# Patient Record
Sex: Female | Born: 1990 | Race: Black or African American | Hispanic: No | Marital: Single | Smoking: Former smoker
Health system: Southern US, Community
[De-identification: ages and names within clinical notes are randomized; demographics above are authoritative.]

## PROBLEM LIST (undated history)

## (undated) DIAGNOSIS — F419 Anxiety disorder, unspecified: Secondary | ICD-10-CM

## (undated) DIAGNOSIS — F32A Depression, unspecified: Secondary | ICD-10-CM

## (undated) DIAGNOSIS — K589 Irritable bowel syndrome without diarrhea: Secondary | ICD-10-CM

## (undated) DIAGNOSIS — K219 Gastro-esophageal reflux disease without esophagitis: Secondary | ICD-10-CM

## (undated) DIAGNOSIS — N809 Endometriosis, unspecified: Secondary | ICD-10-CM

## (undated) HISTORY — DX: Anxiety disorder, unspecified: F41.9

## (undated) HISTORY — PX: COLONOSCOPY: SHX174

## (undated) HISTORY — DX: Irritable bowel syndrome, unspecified: K58.9

## (undated) HISTORY — DX: Depression, unspecified: F32.A

## (undated) HISTORY — DX: Endometriosis, unspecified: N80.9

---

## 2012-11-14 ENCOUNTER — Emergency Department (HOSPITAL_COMMUNITY)
Admission: EM | Admit: 2012-11-14 | Discharge: 2012-11-14 | Disposition: A | Discharge status: Home / Self Care | Payer: BC Managed Care – PPO | Source: Home / Self Care | Attending: Emergency Medicine | Admitting: Emergency Medicine

## 2012-11-14 ENCOUNTER — Encounter (HOSPITAL_COMMUNITY): Payer: Self-pay | Admitting: *Deleted

## 2012-11-14 DIAGNOSIS — N949 Unspecified condition associated with female genital organs and menstrual cycle: Secondary | ICD-10-CM

## 2012-11-14 DIAGNOSIS — R102 Pelvic and perineal pain: Secondary | ICD-10-CM

## 2012-11-14 DIAGNOSIS — G8929 Other chronic pain: Secondary | ICD-10-CM

## 2012-11-14 LAB — POCT URINALYSIS DIP (DEVICE)
Bilirubin Urine: NEGATIVE
Nitrite: NEGATIVE
Urobilinogen, UA: 0.2 mg/dL (ref 0.0–1.0)
pH: 6 (ref 5.0–8.0)

## 2012-11-14 MED ORDER — NITROFURANTOIN MONOHYD MACRO 100 MG PO CAPS: 100.0000 mg | ORAL_CAPSULE | Freq: Two times a day (BID) | ORAL | Status: AC

## 2012-11-14 MED ORDER — NAPROXEN 375 MG PO TABS: 375.0000 mg | ORAL_TABLET | Freq: Two times a day (BID) | ORAL | Status: AC

## 2012-11-14 NOTE — ED Notes (Signed)
C/o low abdominal pain. Has been going on for years on her R side.  Started on L side 1 month ago.  Pain comes and goes and started back yesterday on both sides.  C/o nausea and headache yesterday. No vomiting or diarrhea. No vaginal discharge.  No UTI symptoms.  LMP 10/24/12 only 3 days, usually 4-5 days.

## 2012-11-14 NOTE — ED Provider Notes (Signed)
Is is her main desire or History     CSN: [id number]  Arrival date & time 11/14/12  1632   First MD Initiated Contact with Patient 11/14/12 1700      Chief Complaint  Patient presents with  . Abdominal Pain    (Consider location/radiation/quality/duration/timing/severity/associated sxs/prior treatment) HPI Comments: Patient presents urgent care complaining of pelvic pain describes a for years she would have right sided pain sometimes in the middle of her cycle but for about a month she's been having a predominantly left-sided pain comes and goes. He started back yesterday on both sides have felt some nausea. Denies any vaginal bleeding, urinary symptoms or vaginal discharge. Patient is non-sexually active her last nausea. Was March 12.  Patient is a 22 y.o. female presenting with abdominal pain. The history is provided by the patient.  Abdominal Pain Pain location:  Suprapubic, LLQ and RLQ Pain quality: aching   Pain quality: not bloating and not burning   Pain radiates to:  Does not radiate Pain severity:  Mild Progression:  Waxing and waning Context: not alcohol use, not awakening from sleep, not diet changes and not eating   Relieved by:  Nothing Worsened by:  Nothing tried Ineffective treatments:  None tried Associated symptoms: nausea   Associated symptoms: no chest pain, no chills, no cough, no diarrhea, no dysuria, no fatigue, no fever, no hematuria, no shortness of breath, no vaginal bleeding and no vomiting     History reviewed. No pertinent past medical history.  History reviewed. No pertinent past surgical history.  Family History  Problem Relation Age of Onset  . Diabetes Father     History  Substance Use Topics  . Smoking status: Current Some Day Smoker    Types: Cigars  . Smokeless tobacco: Not on file  . Alcohol Use: Yes     Comment: occasional    OB History   Grav Para Term Preterm Abortions TAB SAB Ect Mult Living                  Review of  Systems  Constitutional: Negative for fever, chills, diaphoresis, activity change, appetite change and fatigue.  Respiratory: Negative for cough and shortness of breath.   Cardiovascular: Negative for chest pain and leg swelling.  Gastrointestinal: Positive for nausea and abdominal pain. Negative for vomiting, diarrhea, abdominal distention, anal bleeding and rectal pain.  Genitourinary: Positive for pelvic pain. Negative for dysuria, urgency, frequency, hematuria, flank pain, decreased urine volume, vaginal bleeding and genital sores.  Musculoskeletal: Negative for back pain and arthralgias.  Skin: Negative for color change.    Allergies  Metronidazole  Home Medications   Current Outpatient Rx  Name  Route  Sig  Dispense  Refill  . aspirin-sod bicarb-citric acid (ALKA-SELTZER) 325 MG TBEF   Oral   Take 325 mg by mouth every 6 (six) hours as needed.           BP 115/72  Pulse 108  Temp(Src) 98.8 F (37.1 C) (Oral)  Resp 16  SpO2 96%  LMP 10/24/2012  Physical Exam  Nursing note reviewed. Constitutional: Vital signs are normal. She appears well-developed and well-nourished.  Non-toxic appearance. She does not have a sickly appearance. She does not appear ill. No distress.  Eyes: No scleral icterus.  Cardiovascular: Normal rate.  Exam reveals no gallop and no friction rub.   No murmur heard. Abdominal: Soft. She exhibits no distension and no mass. There is no hepatomegaly. There is tenderness in the suprapubic  area. There is no rebound, no guarding, no tenderness at McBurney's point and negative Murphy's sign. No hernia. Hernia confirmed negative in the left inguinal area.  Neurological: She is alert.  Skin: Skin is warm. No rash noted. No erythema.    ED Course  Procedures (including critical care time)  Labs Reviewed  POCT URINALYSIS DIP (DEVICE) - Abnormal; Notable for the following:    Ketones, ur 40 (*)    Hgb urine dipstick TRACE (*)    Protein, ur 30 (*)     Leukocytes, UA TRACE (*)    All other components within normal limits  URINE CULTURE  POCT PREGNANCY, URINE   No results found.   No diagnosis found.    MDM  Pelvic pain x year- soft abdomen, afebrile abnormal urine have sent a sample for urine culture. Started patient on Macrobid him for urine culture results become available. Patient has been instructed to have a comprehensive formal pelvic evaluation that would include a TRANSVAGINAL- OR PELVIC ULTRASOUND patient been symptomatic for years is likely- expressing ovarian pathology  Several referrals for a local gynecologist have been provided the patient.       Jimmie Molly, MD 11/14/12 [phone]

## 2012-11-16 LAB — URINE CULTURE

## 2012-11-28 ENCOUNTER — Telehealth (HOSPITAL_COMMUNITY): Payer: Self-pay | Admitting: *Deleted

## 2012-11-28 NOTE — ED Notes (Signed)
Pt. called on VM and asked for result of urine culture. I called pt. back and left a message to call back tomorrow between 2-4 p @ [phone].

## 2013-02-18 ENCOUNTER — Other Ambulatory Visit: Payer: Self-pay | Admitting: Obstetrics & Gynecology

## 2013-02-20 ENCOUNTER — Encounter (HOSPITAL_COMMUNITY): Payer: Self-pay | Admitting: Pharmacist

## 2013-03-04 ENCOUNTER — Encounter (HOSPITAL_COMMUNITY): Payer: Self-pay | Admitting: *Deleted

## 2013-03-05 ENCOUNTER — Ambulatory Visit (HOSPITAL_COMMUNITY): Payer: BC Managed Care – PPO | Admitting: Anesthesiology

## 2013-03-05 ENCOUNTER — Encounter (HOSPITAL_COMMUNITY)
Admission: RE | Disposition: A | Discharge status: Home / Self Care | Payer: Self-pay | Source: Ambulatory Visit | Attending: Obstetrics & Gynecology

## 2013-03-05 ENCOUNTER — Encounter (HOSPITAL_COMMUNITY): Payer: Self-pay | Admitting: Anesthesiology

## 2013-03-05 ENCOUNTER — Ambulatory Visit (HOSPITAL_COMMUNITY)
Admission: RE | Admit: 2013-03-05 | Discharge: 2013-03-05 | Disposition: A | Discharge status: Home / Self Care | Payer: BC Managed Care – PPO | Source: Ambulatory Visit | Attending: Obstetrics & Gynecology | Admitting: Obstetrics & Gynecology

## 2013-03-05 DIAGNOSIS — K219 Gastro-esophageal reflux disease without esophagitis: Secondary | ICD-10-CM | POA: Insufficient documentation

## 2013-03-05 DIAGNOSIS — N83209 Unspecified ovarian cyst, unspecified side: Secondary | ICD-10-CM | POA: Insufficient documentation

## 2013-03-05 DIAGNOSIS — N803 Endometriosis of pelvic peritoneum, unspecified: Secondary | ICD-10-CM | POA: Insufficient documentation

## 2013-03-05 DIAGNOSIS — N801 Endometriosis of ovary: Secondary | ICD-10-CM | POA: Insufficient documentation

## 2013-03-05 DIAGNOSIS — N949 Unspecified condition associated with female genital organs and menstrual cycle: Secondary | ICD-10-CM | POA: Insufficient documentation

## 2013-03-05 DIAGNOSIS — N80109 Endometriosis of ovary, unspecified side, unspecified depth: Secondary | ICD-10-CM | POA: Insufficient documentation

## 2013-03-05 HISTORY — DX: Gastro-esophageal reflux disease without esophagitis: K21.9

## 2013-03-05 HISTORY — PX: ROBOTIC ASSISTED LAPAROSCOPIC OVARIAN CYSTECTOMY: SHX6081

## 2013-03-05 LAB — CBC
MCV: 83.5 fL (ref 78.0–100.0)
Platelets: 230 10*3/uL (ref 150–400)
RBC: 4.68 MIL/uL (ref 3.87–5.11)
WBC: 4.7 10*3/uL (ref 4.0–10.5)

## 2013-03-05 LAB — PREGNANCY, URINE: Preg Test, Ur: NEGATIVE

## 2013-03-05 SURGERY — ROBOTIC ASSISTED LAPAROSCOPIC OVARIAN CYSTECTOMY
Anesthesia: General | Site: Abdomen | Laterality: Left | Wound class: Clean Contaminated

## 2013-03-05 MED ORDER — PROPOFOL 10 MG/ML IV BOLUS
INTRAVENOUS | Status: DC | PRN
  Administered 2013-03-05: 180 mg via INTRAVENOUS
  Administered 2013-03-05: 20 mg via INTRAVENOUS

## 2013-03-05 MED ORDER — LACTATED RINGERS IV SOLN
INTRAVENOUS | Status: DC
  Administered 2013-03-05 (×3): via INTRAVENOUS

## 2013-03-05 MED ORDER — MIDAZOLAM HCL 2 MG/2ML IJ SOLN
INTRAMUSCULAR | Status: AC
  Filled 2013-03-05: qty 2

## 2013-03-05 MED ORDER — LIDOCAINE HCL (CARDIAC) 20 MG/ML IV SOLN
INTRAVENOUS | Status: DC | PRN
  Administered 2013-03-05: 50 mg via INTRAVENOUS

## 2013-03-05 MED ORDER — METOCLOPRAMIDE HCL 5 MG/ML IJ SOLN: 10.0000 mg | Freq: Once | INTRAMUSCULAR | Status: DC | PRN

## 2013-03-05 MED ORDER — FENTANYL CITRATE 0.05 MG/ML IJ SOLN
INTRAMUSCULAR | Status: AC
  Administered 2013-03-05: 25 ug via INTRAVENOUS
  Filled 2013-03-05: qty 2

## 2013-03-05 MED ORDER — GLYCOPYRROLATE 0.2 MG/ML IJ SOLN
INTRAMUSCULAR | Status: AC
  Filled 2013-03-05: qty 2

## 2013-03-05 MED ORDER — FENTANYL CITRATE 0.05 MG/ML IJ SOLN
INTRAMUSCULAR | Status: DC | PRN
  Administered 2013-03-05: 50 ug via INTRAVENOUS
  Administered 2013-03-05 (×2): 100 ug via INTRAVENOUS

## 2013-03-05 MED ORDER — KETOROLAC TROMETHAMINE 30 MG/ML IJ SOLN
INTRAMUSCULAR | Status: AC
  Filled 2013-03-05: qty 1

## 2013-03-05 MED ORDER — CEFAZOLIN SODIUM-DEXTROSE 2-3 GM-% IV SOLR
2.0000 g | INTRAVENOUS | Status: AC
  Administered 2013-03-05: 2 g via INTRAVENOUS

## 2013-03-05 MED ORDER — FENTANYL CITRATE 0.05 MG/ML IJ SOLN
INTRAMUSCULAR | Status: AC
  Filled 2013-03-05: qty 5

## 2013-03-05 MED ORDER — BUPIVACAINE HCL (PF) 0.25 % IJ SOLN
INTRAMUSCULAR | Status: DC | PRN
  Administered 2013-03-05: 14 mL

## 2013-03-05 MED ORDER — LACTATED RINGERS IR SOLN
Status: DC | PRN
  Administered 2013-03-05: 3000 mL

## 2013-03-05 MED ORDER — ARTIFICIAL TEARS OP OINT
TOPICAL_OINTMENT | OPHTHALMIC | Status: AC
  Filled 2013-03-05: qty 3.5

## 2013-03-05 MED ORDER — GLYCOPYRROLATE 0.2 MG/ML IJ SOLN
INTRAMUSCULAR | Status: DC | PRN
  Administered 2013-03-05: .8 mg via INTRAVENOUS

## 2013-03-05 MED ORDER — ONDANSETRON HCL 4 MG/2ML IJ SOLN
INTRAMUSCULAR | Status: DC | PRN
  Administered 2013-03-05: 4 mg via INTRAVENOUS

## 2013-03-05 MED ORDER — FENTANYL CITRATE 0.05 MG/ML IJ SOLN
25.0000 ug | INTRAMUSCULAR | Status: DC | PRN
  Administered 2013-03-05: 25 ug via INTRAVENOUS

## 2013-03-05 MED ORDER — DEXAMETHASONE SODIUM PHOSPHATE 10 MG/ML IJ SOLN
INTRAMUSCULAR | Status: AC
  Filled 2013-03-05: qty 1

## 2013-03-05 MED ORDER — ONDANSETRON HCL 4 MG/2ML IJ SOLN
INTRAMUSCULAR | Status: AC
  Filled 2013-03-05: qty 2

## 2013-03-05 MED ORDER — ROCURONIUM BROMIDE 100 MG/10ML IV SOLN
INTRAVENOUS | Status: DC | PRN
  Administered 2013-03-05: 10 mg via INTRAVENOUS
  Administered 2013-03-05: 40 mg via INTRAVENOUS
  Administered 2013-03-05: 10 mg via INTRAVENOUS

## 2013-03-05 MED ORDER — NEOSTIGMINE METHYLSULFATE 1 MG/ML IJ SOLN
INTRAMUSCULAR | Status: DC | PRN
  Administered 2013-03-05: 4 mg via INTRAVENOUS

## 2013-03-05 MED ORDER — DEXAMETHASONE SODIUM PHOSPHATE 10 MG/ML IJ SOLN
INTRAMUSCULAR | Status: DC | PRN
  Administered 2013-03-05: 10 mg via INTRAVENOUS

## 2013-03-05 MED ORDER — PROPOFOL 10 MG/ML IV EMUL
INTRAVENOUS | Status: AC
  Filled 2013-03-05: qty 20

## 2013-03-05 MED ORDER — CEFAZOLIN SODIUM-DEXTROSE 2-3 GM-% IV SOLR
INTRAVENOUS | Status: AC
  Filled 2013-03-05: qty 50

## 2013-03-05 MED ORDER — BUPIVACAINE HCL (PF) 0.25 % IJ SOLN
INTRAMUSCULAR | Status: AC
  Filled 2013-03-05: qty 30

## 2013-03-05 MED ORDER — NEOSTIGMINE METHYLSULFATE 1 MG/ML IJ SOLN
INTRAMUSCULAR | Status: AC
  Filled 2013-03-05: qty 1

## 2013-03-05 MED ORDER — ROCURONIUM BROMIDE 50 MG/5ML IV SOLN
INTRAVENOUS | Status: AC
  Filled 2013-03-05: qty 1

## 2013-03-05 MED ORDER — MIDAZOLAM HCL 5 MG/5ML IJ SOLN
INTRAMUSCULAR | Status: DC | PRN
  Administered 2013-03-05: 2 mg via INTRAVENOUS

## 2013-03-05 MED ORDER — KETOROLAC TROMETHAMINE 30 MG/ML IJ SOLN
INTRAMUSCULAR | Status: DC | PRN
  Administered 2013-03-05: 30 mg via INTRAVENOUS

## 2013-03-05 MED ORDER — MEPERIDINE HCL 25 MG/ML IJ SOLN: 6.2500 mg | INTRAMUSCULAR | Status: DC | PRN

## 2013-03-05 MED ORDER — OXYCODONE-ACETAMINOPHEN 7.5-325 MG PO TABS: 1.0000 | ORAL_TABLET | ORAL | Status: DC | PRN

## 2013-03-05 MED ORDER — LIDOCAINE HCL (CARDIAC) 20 MG/ML IV SOLN
INTRAVENOUS | Status: AC
  Filled 2013-03-05: qty 5

## 2013-03-05 SURGICAL SUPPLY — 62 items
BARRIER ADHS 3X4 INTERCEED (GAUZE/BANDAGES/DRESSINGS) ×4 IMPLANT
CATH FOLEY 3WAY  5CC 16FR (CATHETERS) ×1
CATH FOLEY 3WAY 5CC 16FR (CATHETERS) ×1 IMPLANT
CHLORAPREP W/TINT 26ML (MISCELLANEOUS) ×2 IMPLANT
CLOTH BEACON ORANGE TIMEOUT ST (SAFETY) ×2 IMPLANT
CONT PATH 16OZ SNAP LID 3702 (MISCELLANEOUS) ×2 IMPLANT
COVER MAYO STAND STRL (DRAPES) ×2 IMPLANT
COVER TABLE BACK 60X90 (DRAPES) ×4 IMPLANT
COVER TIP SHEARS 8 DVNC (MISCELLANEOUS) ×1 IMPLANT
COVER TIP SHEARS 8MM DA VINCI (MISCELLANEOUS) ×1
DECANTER SPIKE VIAL GLASS SM (MISCELLANEOUS) ×2 IMPLANT
DERMABOND ADVANCED (GAUZE/BANDAGES/DRESSINGS) ×1
DERMABOND ADVANCED .7 DNX12 (GAUZE/BANDAGES/DRESSINGS) ×1 IMPLANT
DRAPE HUG U DISPOSABLE (DRAPE) ×2 IMPLANT
DRAPE LG THREE QUARTER DISP (DRAPES) ×4 IMPLANT
DRAPE WARM FLUID 44X44 (DRAPE) ×2 IMPLANT
ELECT REM PT RETURN 9FT ADLT (ELECTROSURGICAL) ×2
ELECTRODE REM PT RTRN 9FT ADLT (ELECTROSURGICAL) ×1 IMPLANT
EVACUATOR SMOKE 8.L (FILTER) ×2 IMPLANT
GAUZE VASELINE 3X9 (GAUZE/BANDAGES/DRESSINGS) IMPLANT
GLOVE BIO SURGEON STRL SZ 6.5 (GLOVE) ×2 IMPLANT
GLOVE BIOGEL PI IND STRL 7.0 (GLOVE) ×1 IMPLANT
GLOVE BIOGEL PI INDICATOR 7.0 (GLOVE) ×1
GOWN STRL REIN XL XLG (GOWN DISPOSABLE) ×12 IMPLANT
IV STOPCOCK 4 WAY 40  W/Y SET (IV SOLUTION)
IV STOPCOCK 4 WAY 40 W/Y SET (IV SOLUTION) IMPLANT
KIT ACCESSORY DA VINCI DISP (KITS) ×1
KIT ACCESSORY DVNC DISP (KITS) ×1 IMPLANT
NEEDLE HYPO 22GX1.5 SAFETY (NEEDLE) IMPLANT
OCCLUDER COLPOPNEUMO (BALLOONS) IMPLANT
PACK LAVH (CUSTOM PROCEDURE TRAY) ×2 IMPLANT
PAD PREP 24X48 CUFFED NSTRL (MISCELLANEOUS) ×4 IMPLANT
PROTECTOR NERVE ULNAR (MISCELLANEOUS) ×4 IMPLANT
SET CYSTO W/LG BORE CLAMP LF (SET/KITS/TRAYS/PACK) IMPLANT
SET IRRIG TUBING LAPAROSCOPIC (IRRIGATION / IRRIGATOR) ×4 IMPLANT
SOLUTION ELECTROLUBE (MISCELLANEOUS) ×2 IMPLANT
SUT VIC AB 0 CT1 27 (SUTURE)
SUT VIC AB 0 CT1 27XBRD ANTBC (SUTURE) IMPLANT
SUT VIC AB 0 CT2 27 (SUTURE) IMPLANT
SUT VIC AB 2-0 CT1 27 (SUTURE)
SUT VIC AB 2-0 CT1 TAPERPNT 27 (SUTURE) IMPLANT
SUT VIC AB 3-0 SH 27 (SUTURE)
SUT VIC AB 3-0 SH 27X BRD (SUTURE) IMPLANT
SUT VIC AB 4-0 PS2 27 (SUTURE) ×4 IMPLANT
SUT VICRYL 0 UR6 27IN ABS (SUTURE) ×4 IMPLANT
SYR 50ML LL SCALE MARK (SYRINGE) ×2 IMPLANT
SYSTEM CONVERTIBLE TROCAR (TROCAR) ×2 IMPLANT
TIP UTERINE 5.1X6CM LAV DISP (MISCELLANEOUS) IMPLANT
TIP UTERINE 6.7X10CM GRN DISP (MISCELLANEOUS) IMPLANT
TIP UTERINE 6.7X6CM WHT DISP (MISCELLANEOUS) IMPLANT
TIP UTERINE 6.7X8CM BLUE DISP (MISCELLANEOUS) IMPLANT
TOWEL OR 17X24 6PK STRL BLUE (TOWEL DISPOSABLE) ×6 IMPLANT
TRAY FOLEY BAG SILVER LF 14FR (CATHETERS) IMPLANT
TROCAR 12M 150ML BLUNT (TROCAR) IMPLANT
TROCAR DISP BLADELESS 8 DVNC (TROCAR) ×1 IMPLANT
TROCAR DISP BLADELESS 8MM (TROCAR) ×1
TROCAR OPTI TIP 12M 100M (ENDOMECHANICALS) IMPLANT
TROCAR XCEL 12X100 BLDLESS (ENDOMECHANICALS) ×2 IMPLANT
TROCAR XCEL NON-BLD 5MMX100MML (ENDOMECHANICALS) ×2 IMPLANT
TUBING FILTER THERMOFLATOR (ELECTROSURGICAL) ×4 IMPLANT
WARMER LAPAROSCOPE (MISCELLANEOUS) ×2 IMPLANT
WATER STERILE IRR 1000ML POUR (IV SOLUTION) ×6 IMPLANT

## 2013-03-05 NOTE — Op Note (Addendum)
03/05/2013  3:03 PM  PATIENT:  Kristen Barnett  22 y.o. female  PRE-OPERATIVE DIAGNOSIS:  Pelvic Pain, Complex Left Ovarian Cyst  [phone]  POST-OPERATIVE DIAGNOSIS:  Left and right ovarian endometriomas; Moderate pelvic endometriosis; Pelvic adhesions  PROCEDURE:  Procedure(s): ROBOTIC ASSISTED LAPAROSCOPIC BILATERAL OVARIAN CYSTECTOMIES; LYSIS OF ADHESIONS  With Treatment of Endometriosis.  SURGEON:  Surgeon(s): Genia Del, MD  ASSISTANTS: Shea Evans MD   ANESTHESIA:   general  PROCEDURE:  Under general anesthesia with endotracheal intubation, patient is in decubitus position.  She is prepped with Chloraprep on the abdomen and Betadine on suprapubic, vulvar and vaginal areas.  The uterus is cannulated with an Ulka canula.  A Foley is inserted in the bladder.  All incision sites are infiltrated with Marcaine 0.25% plain and incisions are made with the scalpel at the infraumbilical area and 2 on each side of the lower abdomen.  A pursestring stitch is done with a Vicryl 0 at the aponeurosis at the infraumbilical incision.  The Roseanne Reno is introduced with the camera at that level.  The abdominal cavity is normal in appearance with a normal liver, gall-bladder and appendix.  The pelvic cavity shows moderate endometriosis.  The uterus is normal in size and appearance.  Both tubes are normal in appearance with normal fimbriae.  Both ovaries are adherent to their ovarian fossae, to the back of the uterus and to the sigmoid.  The left ovary has an endometrioma of about 3 cm in diameter.  A smaller endometrioma is present on the right ovary.  The sigmoid is adherent to the post uterus to the uterosacral ligament attachments.   The ports are inserted in a semi-circular configuration under direct vision.  The robot is docked from the right side.  Instruments are put in place under direct vision with the Endoshear scisors on the right arm, the PK in the left arm and the fenestrated clamp in the 3rd arm.   We then go to the console.  Both ureters are in normal anatomic position.  Pictures are taken at the beginning and at the end of the procedure.  We proceed with lysis of adhesions, freeing the left ovary completely.  The endometrioma is rupture during this process and typical brown thick fluid escapes.  The Nezhat is used to irrigate and suction.  The tip of the endoshear scisors is used to open the left ovary at the level of the endometrioma.  The wall of the endometrioma is removed by traction completely and sent to pathology.  Hemostasis is completed in the bed of the left ovary.  We then lyse the adhesions around the right ovary.  A smaller endometrioma is removed from that ovary as well and the wall sent to pathology.  Hemostasis is completed in the bed of the right ovary.  The decision is made not to lyse adhesions between the sigmoid and the lower uterus as those adhesions are thick and the risk of bowel trauma is higher than the possible benefits.  No adhesion is present elsewhere.  No endometriosis is present in the anterior cul-the-sac.  Hemostasis is adequate at all levels.  The robotic instruments are therefore removed.  The robot is undocked and the surgery is continued by laparoscopy.        The deep Trendelenburg is removed.  Irrigation and suction is completed.  Interseed is applied in the posterior cul-the-sac and around both ovaries.  All laparoscopic instruments are removed.  The CO2 is evacuated.  The purstring stitch is  attached at the infraumbilical incision.  A figure of eight of vicryl 0 is added at the aponeurosis to completely close it.  The skin is closed with Vicryl 4-0 on all incisions and Dermabond is added.  Hemostasis is adequate.  The vaginal canula is removed.  The patient is brought to recovery room in good and stable status.  ESTIMATED BLOOD LOSS:  25 cc   Intake/Output Summary (Last 24 hours) at 03/05/13 1503 Last data filed at 03/05/13 1453  Gross per 24 hour  Intake    1700 ml  Output    525 ml  Net   1175 ml     BLOOD ADMINISTERED:none   LOCAL MEDICATIONS USED:  MARCAINE     SPECIMEN:  Source of Specimen:  Left and right ovarian cyst walls  DISPOSITION OF SPECIMEN:  PATHOLOGY  COUNTS:  YES  PLAN OF CARE: Transfer to PACU  Genia Del MD  03/05/2013 at 3:06 pm

## 2013-03-05 NOTE — OR Nursing (Signed)
Olegario Messier ganim rn documenting under [employer] employee number . Margarita Mail rn

## 2013-03-05 NOTE — Transfer of Care (Signed)
Immediate Anesthesia Transfer of Care Note  Patient: Kristen Barnett  Procedure(s) Performed: Procedure(s) with comments: ROBOTIC ASSISTED LAPAROSCOPIC BILATERAL OVARIAN CYSTECTOMY; LYSIS OF ADHESIONS  With Treatment of Endometriosis (Left) - 2 hrs.  Patient Location: PACU  Anesthesia Type:General  Level of Consciousness: awake  Airway & Oxygen Therapy: Patient Spontanous Breathing  Post-op Assessment: Report given to PACU RN  Post vital signs: stable  Filed Vitals:   03/05/13 1210  BP: 124/83  Pulse: 84  Temp: 37 C  Resp: 18    Complications: No apparent anesthesia complications

## 2013-03-05 NOTE — Anesthesia Preprocedure Evaluation (Addendum)
Anesthesia Evaluation    Airway Mallampati: I TM Distance: >3 FB Neck ROM: Full    Dental no notable dental hx. (+) Teeth Intact   Pulmonary Current Smoker,  breath sounds clear to auscultation  Pulmonary exam normal       Cardiovascular negative cardio ROS  Rhythm:Regular Rate:Normal     Neuro/Psych negative neurological ROS  negative psych ROS   GI/Hepatic Neg liver ROS, GERD-  Medicated and Controlled,  Endo/Other  negative endocrine ROS  Renal/GU negative Renal ROS  negative genitourinary   Musculoskeletal negative musculoskeletal ROS (+)   Abdominal Normal abdominal exam  (+)   Peds  Hematology negative hematology ROS (+)   Anesthesia Other Findings   Reproductive/Obstetrics Pelvic Pain Ovarian Cyst                         Anesthesia Physical Anesthesia Plan  ASA: II  Anesthesia Plan: General   Post-op Pain Management:    Induction: Intravenous  Airway Management Planned: Oral ETT  Additional Equipment:   Intra-op Plan:   Post-operative Plan: Extubation in OR  Informed Consent: I have reviewed the patients History and Physical, chart, labs and discussed the procedure including the risks, benefits and alternatives for the proposed anesthesia with the patient or authorized representative who has indicated his/her understanding and acceptance.   Dental advisory given  Plan Discussed with: Anesthesiologist, CRNA and Surgeon  Anesthesia Plan Comments:         Anesthesia Quick Evaluation

## 2013-03-05 NOTE — Discharge Summary (Signed)
  Physician Discharge Summary  Patient ID: Kristen Barnett MRN: [id number] DOB/AGE: 09-29-1990 21 y.o.  Admit date: 03/05/2013 Discharge date: 03/05/2013  Admission Diagnoses: Pelvic Pain, Complex Left Ovarian Cyst  [id number]  Discharge Diagnoses: Pelvic Pain, Complex Left Ovarian Cyst  [phone]        Active Problems:   * No active hospital problems. *   Discharged Condition: good  Hospital Course: Outpatient  Consults: None  Treatments: surgery: Robotic bilateral ovarian cystectomies, lysis of adhesions, treatment of endometriosis.  Disposition: 01-Home or Self Care     Medication List         ibuprofen 200 MG tablet  Commonly known as:  ADVIL,MOTRIN  Take 400 mg by mouth every 6 (six) hours as needed for pain.     oxyCODONE-acetaminophen 7.5-325 MG per tablet  Commonly known as:  PERCOCET  Take 1 tablet by mouth every 4 (four) hours as needed for pain.     simethicone 80 MG chewable tablet  Commonly known as:  MYLICON  Chew 80 mg by mouth daily as needed for flatulence.           Follow-up Information   Follow up with Caria Transue,MARIE-LYNE, MD In 3 weeks.   Contact information:   [address] [place] [address] [phone]       Signed: Genia Del, MD 03/05/2013, 3:31 PM

## 2013-03-05 NOTE — H&P (Signed)
Kristen Barnett is an 22 y.o. female G0  RP:  Left ovarian solid lesion  Pertinent Gynecological History: Menses: flow is moderate Contraception: none Blood transfusions: none Sexually transmitted diseases: no past history Previous GYN Procedures: None  Last mammogram:  none Last pap: normal OB History:  G0   Menstrual History:  Patient's last menstrual period was 02/19/2013.    Past Medical History  Diagnosis Date  . GERD (gastroesophageal reflux disease)     History -diet controlled - no meds    History reviewed. No pertinent past surgical history.  Family History  Problem Relation Age of Onset  . Diabetes Father     Social History:  reports that she has been smoking Cigars.  She has never used smokeless tobacco. She reports that  drinks alcohol. She reports that she does not use illicit drugs.  Allergies:  Allergies  Allergen Reactions  . Metronidazole Nausea And Vomiting    Made her kidneys hurt.    Prescriptions prior to admission  Medication Sig Dispense Refill  . ibuprofen (ADVIL,MOTRIN) 200 MG tablet Take 400 mg by mouth every 6 (six) hours as needed for pain.      . simethicone (MYLICON) 80 MG chewable tablet Chew 80 mg by mouth daily as needed for flatulence.        Blood pressure 124/83, pulse 84, temperature 98.6 F (37 C), temperature source Oral, resp. rate 18, height 5\' 1"  (1.549 m), weight 71.215 kg (157 lb), last menstrual period 02/19/2013, SpO2 100.00%.  Pelvic US  Left ov. Solid lesion 2.3 cm, possible fibroma or endometrioma.  Results for orders placed during the hospital encounter of 03/05/13 (from the past 24 hour(s))  PREGNANCY, URINE     Status: None   Collection Time    03/05/13 12:00 PM      Result Value Range   Preg Test, Ur NEGATIVE  NEGATIVE  CBC     Status: None   Collection Time    03/05/13 12:14 PM      Result Value Range   WBC 4.7  4.0 - 10.5 K/uL   RBC 4.68  3.87 - 5.11 MIL/uL   Hemoglobin 13.1  12.0 - 15.0 g/dL   HCT  16.1  09.6 - 04.5 %   MCV 83.5  78.0 - 100.0 fL   MCH 28.0  26.0 - 34.0 pg   MCHC 33.5  30.0 - 36.0 g/dL   RDW 40.9  81.1 - 91.4 %   Platelets 230  150 - 400 K/uL    No results found.  Assessment/Plan: Left ovarian lesion for Left ovarian cystectomy, poss. Lt oophorectomy assisted with Engineer, building services.  Surgery and risks reviewed.  Edger Husain,MARIE-LYNE 03/05/2013, 12:57 PM

## 2013-03-05 NOTE — Anesthesia Postprocedure Evaluation (Signed)
  Anesthesia Post-op Note  Patient: Kristen Barnett  Procedure(s) Performed: Procedure(s) with comments: ROBOTIC ASSISTED LAPAROSCOPIC BILATERAL OVARIAN CYSTECTOMY; LYSIS OF ADHESIONS  With Treatment of Endometriosis (Left) - 2 hrs.  Patient Location: PACU  Anesthesia Type:General  Level of Consciousness: awake, alert  and oriented  Airway and Oxygen Therapy: Patient Spontanous Breathing  Post-op Pain: mild  Post-op Assessment: Post-op Vital signs reviewed, Patient's Cardiovascular Status Stable, Respiratory Function Stable, Patent Airway, No signs of Nausea or vomiting and Pain level controlled  Post-op Vital Signs: Reviewed and stable  Complications: No apparent anesthesia complications

## 2013-03-06 ENCOUNTER — Encounter (HOSPITAL_COMMUNITY): Payer: Self-pay | Admitting: Obstetrics & Gynecology

## 2018-12-31 ENCOUNTER — Ambulatory Visit
Admission: RE | Admit: 2018-12-31 | Discharge: 2018-12-31 | Disposition: A | Discharge status: Home / Self Care | Payer: Self-pay | Source: Ambulatory Visit | Attending: Urgent Care | Admitting: Urgent Care

## 2018-12-31 ENCOUNTER — Other Ambulatory Visit: Payer: Self-pay

## 2018-12-31 ENCOUNTER — Other Ambulatory Visit: Payer: Self-pay | Admitting: Urgent Care

## 2018-12-31 DIAGNOSIS — M79641 Pain in right hand: Secondary | ICD-10-CM

## 2018-12-31 DIAGNOSIS — M79605 Pain in left leg: Secondary | ICD-10-CM

## 2021-04-01 DIAGNOSIS — K589 Irritable bowel syndrome without diarrhea: Secondary | ICD-10-CM | POA: Insufficient documentation

## 2021-04-09 ENCOUNTER — Encounter: Payer: Self-pay | Admitting: Nurse Practitioner

## 2021-05-10 ENCOUNTER — Other Ambulatory Visit: Payer: Self-pay

## 2021-05-13 ENCOUNTER — Encounter: Payer: Self-pay | Admitting: Nurse Practitioner

## 2021-05-13 ENCOUNTER — Ambulatory Visit (INDEPENDENT_AMBULATORY_CARE_PROVIDER_SITE_OTHER): Payer: BC Managed Care – PPO | Admitting: Nurse Practitioner

## 2021-05-13 ENCOUNTER — Other Ambulatory Visit (INDEPENDENT_AMBULATORY_CARE_PROVIDER_SITE_OTHER): Payer: BC Managed Care – PPO

## 2021-05-13 VITALS — BP 108/64 | HR 74 | Ht 61.0 in | Wt 157.0 lb

## 2021-05-13 DIAGNOSIS — K5900 Constipation, unspecified: Secondary | ICD-10-CM | POA: Diagnosis not present

## 2021-05-13 DIAGNOSIS — R1084 Generalized abdominal pain: Secondary | ICD-10-CM

## 2021-05-13 DIAGNOSIS — K625 Hemorrhage of anus and rectum: Secondary | ICD-10-CM

## 2021-05-13 LAB — CBC WITH DIFFERENTIAL/PLATELET
Basophils Absolute: 0 10*3/uL (ref 0.0–0.1)
Basophils Relative: 0.6 % (ref 0.0–3.0)
Eosinophils Absolute: 0 10*3/uL (ref 0.0–0.7)
Eosinophils Relative: 0.5 % (ref 0.0–5.0)
HCT: 37 % (ref 36.0–46.0)
Hemoglobin: 12.1 g/dL (ref 12.0–15.0)
Lymphocytes Relative: 24.1 % (ref 12.0–46.0)
Lymphs Abs: 1.3 10*3/uL (ref 0.7–4.0)
MCHC: 32.8 g/dL (ref 30.0–36.0)
MCV: 85 fl (ref 78.0–100.0)
Monocytes Absolute: 0.4 10*3/uL (ref 0.1–1.0)
Monocytes Relative: 8 % (ref 3.0–12.0)
Neutro Abs: 3.5 10*3/uL (ref 1.4–7.7)
Neutrophils Relative %: 66.8 % (ref 43.0–77.0)
Platelets: 207 10*3/uL (ref 150.0–400.0)
RBC: 4.36 Mil/uL (ref 3.87–5.11)
RDW: 13.4 % (ref 11.5–15.5)
WBC: 5.2 10*3/uL (ref 4.0–10.5)

## 2021-05-13 LAB — COMPREHENSIVE METABOLIC PANEL
ALT: 9 U/L (ref 0–35)
AST: 12 U/L (ref 0–37)
Albumin: 4 g/dL (ref 3.5–5.2)
Alkaline Phosphatase: 51 U/L (ref 39–117)
BUN: 8 mg/dL (ref 6–23)
CO2: 26 mEq/L (ref 19–32)
Calcium: 9 mg/dL (ref 8.4–10.5)
Chloride: 104 mEq/L (ref 96–112)
Creatinine, Ser: 0.77 mg/dL (ref 0.40–1.20)
GFR: 103.1 mL/min (ref >60.00)
Glucose, Bld: 81 mg/dL (ref 70–99)
Potassium: 3.9 mEq/L (ref 3.5–5.1)
Sodium: 138 mEq/L (ref 135–145)
Total Bilirubin: 0.5 mg/dL (ref 0.2–1.2)
Total Protein: 6.9 g/dL (ref 6.0–8.3)

## 2021-05-13 LAB — TSH: TSH: 2.47 u[IU]/mL (ref 0.35–5.50)

## 2021-05-13 NOTE — Patient Instructions (Addendum)
LABS:  Lab work has been ordered for you today. Our lab is located in the basement. Press "B" on the elevator. The lab is located at the first door on the left as you exit the elevator.  HEALTHCARE LAWS AND MY CHART RESULTS: Due to recent changes in healthcare laws, you may see the results of your imaging and laboratory studies on MyChart before your provider has had a chance to review them.   We understand that in some cases there may be results that are confusing or concerning to you. Not all laboratory results come back in the same time frame and the provider may be waiting for multiple results in order to interpret others.  Please give [place] 48 hours in order for your provider to thoroughly review all the results before contacting the office for clarification of your results.   RECOMMENDATIONS: Continue Linzess 290 mcg once a day and may alternate with 145 mcg if needed. Please call our office if your symptoms worsen.  We have scheduled you a follow up with Dr. Lorenso Courier on 07/06/21 at 3:40.  It was great seeing you today! Thank you for entrusting me with your care and choosing [facility].  Noralyn Pick, CRNP  The [place] [facility] providers would like to encourage you to use [facility] to communicate with providers for non-urgent requests or questions.  Due to long hold times on the telephone, sending your provider a message by [facility] may be faster and more efficient way to get a response. Please allow 48 business hours for a response.  Please remember that this is for non-urgent requests/questions. If you are age 31 or older, your body mass index should be between 23-30. Your Body mass index is 29.66 kg/m. If this is out of the aforementioned range listed, please consider follow up with your Primary Care Provider.  If you are age 31 or younger, your body mass index should be between 19-25. Your Body mass index is 29.66 kg/m. If this is out of the aformentioned range listed,  please consider follow up with your Primary Care Provider.

## 2021-05-13 NOTE — Progress Notes (Signed)
05/13/2021 Kristen Barnett [id number] [birth year]   CHIEF COMPLAINT: Constipation  HISTORY OF PRESENT ILLNESS:  Kristen Barnett is a 30 year old female with a past medical history of endometriosis GERD and constipation.  S/P robotic assisted laparoscopic ovarian cystectomy with lysis of adhesions with treatment of endometriosis 2014. She presents to our office today as referred by Everardo Beals NP for further evaluation regarding constipation.  She typically passed a normal formed brown bowel movement most days, however, she developed constipation approximately 3 months ago.  She reported going 2 to 3 days without passing a BM.  She was prescribed Linzess 145 mcg daily by her PCP which she took for 4 days without improvement so the dose was increased to 290 mcg daily.  She took the higher dose of Linzess for 3 days which resulted in 1-2 diarrhea episodes for 2 consecutive days so she stopped taking it.  She contacted her PCP and was then prescribed Linzess 72 mcg daily which was ineffective so she went back to taking Linzess 145 mcg daily.  She reported seeing a small amount of bright red blood on the toilet tissue 1 or 2 years ago which occurred when she was on her menstrual cycle.  Since then, she is seen a few spots of red blood mixed in the stool on 2 or 3 occasions, the last episode occurred 1-1/2 months ago.  No associated anorectal pain.  She has a variable abdominal pain which started a few months ago, initially she had pain to the left mid abdomen then moved to the right lower abdomen and then around the bellybutton area.  No significant abdominal pain.  She is concerned she may have a chronic appendicitis.  No fevers. No family history of IBD or colorectal cancer.  She has a remote history of GERD 11 years ago.  She has infrequent heartburn depend upon what she eats which might occur once every 3 to 4 months.  Initially had some nausea when her constipation began but this has  resolved.   Past Medical History:  Diagnosis Date   GERD (gastroesophageal reflux disease)    History -diet controlled - no meds   Past Surgical History:  Procedure Laterality Date   ROBOTIC ASSISTED LAPAROSCOPIC OVARIAN CYSTECTOMY Left 03/05/2013   Procedure: ROBOTIC ASSISTED LAPAROSCOPIC BILATERAL OVARIAN CYSTECTOMY; LYSIS OF ADHESIONS  With Treatment of Endometriosis;  Surgeon: Princess Bruins, MD;  Location: [facility];  Service: Gynecology;  Laterality: Left;  2 hrs.   Social History: She is single.  She is employed in tech-support.  Smoked cigarettes  1 1/2 years on and off, stopped smoking 8 years ago. Infrequent alcohol use. No drug use. Occasional CBD use.   Family History: Father with diabetes. Mother medical history unknown.  No family history of esophageal, gastric or colon cancer.  Outpatient Encounter Medications as of 05/13/2021  Medication Sig   ibuprofen (ADVIL,MOTRIN) 200 MG tablet Take 400 mg by mouth every 6 (six) hours as needed for pain.   linaclotide (LINZESS) 145 MCG CAPS capsule Linzess 145 mcg capsule  Take 1 capsule every day by oral route for 90 days.   linaclotide (LINZESS) 72 MCG capsule Linzess 72 mcg capsule  TAKE 1 CAPSULE BY MOUTH EVERY DAY FOR 90 DAYS   ondansetron (ZOFRAN-ODT) 8 MG disintegrating tablet ondansetron 8 mg disintegrating tablet  PLACE 1 TABLET 3 TIMES A DAY BY TRANSLINGUAL ROUTE.   oxyCODONE-acetaminophen (PERCOCET) 7.5-325 MG per tablet Take 1 tablet by mouth every 4 (four)  hours as needed for pain.   simethicone (MYLICON) 80 MG chewable tablet Chew 80 mg by mouth daily as needed for flatulence.   No facility-administered encounter medications on file as of 05/13/2021.   REVIEW OF SYSTEMS:  Gen: + Night sweats. No weight loss.  CV: Denies chest pain, palpitations or edema. Resp: Denies cough, shortness of breath of hemoptysis.  GI: See HPI.   GU : Denies urinary burning, blood in urine, increased urinary frequency or incontinence. MS:  + Lower back pain.  Derm: Denies rash, itchiness, skin lesions or unhealing ulcers. Psych: + Anxiety. Heme: Denies bruising, bleeding. Neuro:  + Headaches. No dizziness or paresthesias. Endo:  Denies any problems with DM, thyroid or adrenal function.   PHYSICAL EXAM: BP 108/64   Pulse 74   Ht 5\' 1"  (1.549 m)   Wt 157 lb (71.2 kg)   BMI 29.66 kg/m  General: 30 year old female no acute distress. Head: Normocephalic and atraumatic. Eyes:  Sclerae non-icteric, conjunctive pink. Ears: Normal auditory acuity. Mouth: Dentition intact. No ulcers or lesions.  Neck: Supple, no lymphadenopathy or thyromegaly.  Lungs: Clear bilaterally to auscultation without wheezes, crackles or rhonchi. Heart: Regular rate and rhythm. No murmur, rub or gallop appreciated.  Abdomen: Soft, nontender, non distended. No masses. No hepatosplenomegaly. Normoactive bowel sounds x 4 quadrants.  Rectal: Deferred. Musculoskeletal: Symmetrical with no gross deformities. Skin: Warm and dry. No rash or lesions on visible extremities. Extremities: No edema. Neurological: Alert oriented x 4, no focal deficits.  Psychological:  Alert and cooperative. Normal mood and affect.  ASSESSMENT AND PLAN:  67) 30 year old female with constipation which started 3 months ago, variable response to 3 doses of Linzess -I recommended for the patient to continue to take Linzess 290 mcg 1 p.o. daily if tolerated with the instructions for the first 2 weeks she very well may have 1-2 episodes of diarrhea daily which is expected when initiating Linzess.  However, if she experiences excessive diarrhea then to discontinue this medication.  She may also consider alternating Linzess 290 mcg QOD alternating with  145cmg QOD -TSH, CBC and CMP -She will call our office if she is not unable to manage her constipation with Linzess and alternative regimen will be prescribed  2) She reported having one episode of bright red on the toilet tissue 1 or 2  years ago without similar recurrence, however, she has noted a small dot of blood on the stool on a few occasions since then without associated anorectal pain. -I discussed scheduling a future diagnostic colonoscopy, to discuss further at the time of her follow-up appointment in 2 to 3 months -Patient to call our office if her rectal bleeding recurs    CC:  Everardo Beals, NP

## 2021-05-14 NOTE — Progress Notes (Addendum)
I agree with the assessment and plan as outlined by Ms. Riley Kill. In the future would potentially delve into prior constipation treatments that the patient has tried. Would encourage hydration, ambulation, and fiber.  ADDENDUM 07/04/23: Per patient she was not on any opioids at the time of this clinic visit so I changed my above attestation to reflect this.

## 2021-06-25 ENCOUNTER — Encounter: Payer: Self-pay | Admitting: *Deleted

## 2021-07-02 ENCOUNTER — Ambulatory Visit (INDEPENDENT_AMBULATORY_CARE_PROVIDER_SITE_OTHER): Payer: BC Managed Care – PPO | Admitting: Internal Medicine

## 2021-07-02 ENCOUNTER — Encounter: Payer: Self-pay | Admitting: Internal Medicine

## 2021-07-02 VITALS — BP 112/78 | HR 88 | Ht 61.0 in | Wt 161.4 lb

## 2021-07-02 DIAGNOSIS — K5900 Constipation, unspecified: Secondary | ICD-10-CM | POA: Diagnosis not present

## 2021-07-02 DIAGNOSIS — K625 Hemorrhage of anus and rectum: Secondary | ICD-10-CM

## 2021-07-02 DIAGNOSIS — R194 Change in bowel habit: Secondary | ICD-10-CM | POA: Diagnosis not present

## 2021-07-02 MED ORDER — PLENVU 140 G PO SOLR: 140.0000 g | ORAL | 0 refills | Status: DC

## 2021-07-02 NOTE — Patient Instructions (Signed)
If you are age 31 or older, your body mass index should be between 23-30. Your Body mass index is 30.5 kg/m. If this is out of the aforementioned range listed, please consider follow up with your Primary Care Provider.  If you are age 31 or younger, your body mass index should be between 19-25. Your Body mass index is 30.5 kg/m. If this is out of the aformentioned range listed, please consider follow up with your Primary Care Provider.   ________________________________________________________  The [facility] providers would like to encourage you to use [facility] to communicate with providers for non-urgent requests or questions.  Due to long hold times on the telephone, sending your provider a message by [facility] may be a faster and more efficient way to get a response.  Please allow 48 business hours for a response.  Please remember that this is for non-urgent requests.  _______________________________________________________  Dennis Bast have been scheduled for a colonoscopy. Please follow written instructions given to you at your visit today.  Please pick up your prep supplies at the pharmacy within the next 1-3 days. If you use inhalers (even only as needed), please bring them with you on the day of your procedure.  You have been scheduled for a CT scan of the abdomen and pelvis at [facility] ([address]---this is in the same building as [employer]).   You are scheduled on 08-12-2021 at 10am. You should arrive 15 minutes prior to your appointment time for registration. Please follow the written instructions below on the day of your exam:  WARNING: IF YOU ARE ALLERGIC TO IODINE/X-RAY DYE, PLEASE NOTIFY RADIOLOGY IMMEDIATELY AT [phone]! YOU WILL BE GIVEN A 13 HOUR PREMEDICATION PREP.  1) Do not eat or drink anything after 10:30am (4 hours prior to your test) 2) You have been given 2 bottles of oral contrast to drink. The solution may taste better if refrigerated, but  do NOT add ice or any other liquid to this solution. Shake well before drinking.    Drink 1 bottle of contrast @ 12:30pm (2 hours prior to your exam)  Drink 1 bottle of contrast @ 1:30pm (1 hour prior to your exam)  You may take any medications as prescribed with a small amount of water, if necessary. If you take any of the following medications: METFORMIN, GLUCOPHAGE, GLUCOVANCE, AVANDAMET, RIOMET, FORTAMET, [place] MET, JANUMET, GLUMETZA or METAGLIP, you MAY be asked to HOLD this medication 48 hours AFTER the exam.  The purpose of you drinking the oral contrast is to aid in the visualization of your intestinal tract. The contrast solution may cause some diarrhea. Depending on your individual set of symptoms, you may also receive an intravenous injection of x-ray contrast/dye. Plan on being at [facility] for 30 minutes or longer, depending on the type of exam you are having performed.  This test typically takes 30-45 minutes to complete.  If you have any questions regarding your exam or if you need to reschedule, you may call the CT department at [phone] between the hours of 8:00 am and 5:00 pm, Monday-Friday.   Due to recent changes in healthcare laws, you may see the results of your imaging and laboratory studies on MyChart before your provider has had a chance to review them.  We understand that in some cases there may be results that are confusing or concerning to you. Not all laboratory results come back in the same time frame and the provider may be waiting for  multiple results in order to interpret others.  Please give [place] 48 hours in order for your provider to thoroughly review all the results before contacting the office for clarification of your results.    Please start Miralax 1 capful a day. Increase up to 3 x a day as needed to have 1 bowel movement a day.  It was a pleasure to see you today!  Thank you for trusting me with your gastrointestinal care!      ________________________________________________________________________

## 2021-07-02 NOTE — Progress Notes (Signed)
CHIEF COMPLAINT: Constipation  HISTORY OF PRESENT ILLNESS:  Kristen Barnett is a 30 year old female with a past medical history of endometriosis GERD, constipation, S/P robotic assisted laparoscopic ovarian cystectomy with lysis of adhesions with treatment of endometriosis 2014 presents for constipation  Interval History: She has still had issues with constipation. Has not been able to have stools. She goes once a day. She takes Linzess 145 mg and fiber gummies. She had diarrhea every time that is murky with little pieces in it. This has been going on for 3.5 months, starting around 03/2021. She was prescribed metronidazole for BV around 03/2021 that made her nauseous and dizzy. She always feels like she still has to go. She never feels like she fully empties her bowels. She has a dull pain in the back that does not get better. She used to go the bathroom fairly frequently, and considered herself to have regular BMs. Denies blood in stools. She has lost about 10 lbs.  Endorses vomiting. Endorses RLQ pain, which occurs after she eats. She adjusted her diet completely to try to help with her symptoms.  She has tried Dulcolax in the past. Has never been tried on Miralax in the past.  Wt Readings from Last 3 Encounters:  07/02/21 161 lb 6.4 oz (73.2 kg)  05/13/21 157 lb (71.2 kg)  03/04/13 157 lb (71.2 kg)    Outpatient Encounter Medications as of 07/02/2021  Medication Sig   linaclotide (LINZESS) 145 MCG CAPS capsule Linzess 145 mcg capsule  Take 1 capsule every day by oral route for 90 days.   norgestimate-ethinyl estradiol (ORTHO-CYCLEN) 0.25-35 MG-MCG tablet Take 1 tablet by mouth daily.   PEG-KCl-NaCl-NaSulf-Na Asc-C (PLENVU) 140 g SOLR Take 140 g by mouth as directed. Manufacturer's coupon Universal coupon code:BIN: P2366821; GROUP: [id number]; PCN: [id number]; ID: [id number]; PAY NO MORE $50   No facility-administered encounter medications on file as of 07/02/2021.    PHYSICAL EXAM: BP 112/78    Pulse 88   Ht 5\' 1"  (1.549 m)   Wt 161 lb 6.4 oz (73.2 kg)   BMI 30.50 kg/m  Head: Normocephalic and atraumatic. Eyes:  Sclerae non-icteric, conjunctive pink. Ears: Normal auditory acuity. Mouth: Dentition intact. No ulcers or lesions.  Neck: Supple, no lymphadenopathy or thyromegaly.  Lungs: Clear bilaterally to auscultation without wheezes, crackles or rhonchi. Heart: Regular rate and rhythm. No murmur, rub or gallop appreciated.  Abdomen: Soft, nontender, non distended. No masses. No hepatosplenomegaly. Quiet bowel sounds Rectal: Deferred. Musculoskeletal: Symmetrical with no gross deformities. Skin: Warm and dry. No rash or lesions on visible extremities. Extremities: No edema. Neurological: Alert oriented x 4, no focal deficits.  Psychological:  Alert and cooperative. Normal mood and affect.  ASSESSMENT AND PLAN:  Abdominal discomfort Constipation History of rectal bleeding Change in bowel habits Patient presents with issues with constipation that she feels have not fully improved while on Linzess.  Still has a sense of incomplete evacuation.  Since patient was not previously tried on MiraLAX therapy, will have her do a trial of MiraLAX to see if this will be adequate to help control her constipation symptoms.  If this is not effective, then patient can go back to doing Miralax therapy. Patient had a dramatic change in her bowel habits in 03/2021.  Unclear explanation for why this occurred.  Thus we will plan for a CT A/P with p.o. contrast to check for stool burden and to assess for signs of obstruction. Will also plan for colonoscopy to  rule out colon cancer as a cause of her change in bowel habits. Patient also has a history of endometriosis so could potentially have some bowel wall involvement that could explain her symptoms. -Continue daily fiber supplement -Start Miralax QD. Titrate to 3 BMs per day -Can go back to Linzess if Miralax is not effective -CT A/P  w/contrast -Colonoscopy [facility] -RTC in 2 months -Consider pelvic MRI or transvaginal ultrasound in the future to assess for endometriosis in the future  Sharyn Creamer  I spent 42 minutes of time, including in depth chart review, independent review of results as outlined above, communicating results with the patient directly, face-to-face time with the patient, coordinating care, ordering studies and medications as appropriate, and documentation.

## 2021-07-06 ENCOUNTER — Ambulatory Visit: Payer: BC Managed Care – PPO | Admitting: Internal Medicine

## 2021-07-13 ENCOUNTER — Other Ambulatory Visit: Payer: BC Managed Care – PPO

## 2021-07-16 ENCOUNTER — Ambulatory Visit (INDEPENDENT_AMBULATORY_CARE_PROVIDER_SITE_OTHER)
Admission: RE | Admit: 2021-07-16 | Discharge: 2021-07-16 | Disposition: A | Discharge status: Home / Self Care | Payer: BC Managed Care – PPO | Source: Ambulatory Visit | Attending: Internal Medicine | Admitting: Internal Medicine

## 2021-07-16 ENCOUNTER — Other Ambulatory Visit: Payer: Self-pay

## 2021-07-16 DIAGNOSIS — R194 Change in bowel habit: Secondary | ICD-10-CM

## 2021-07-16 DIAGNOSIS — K625 Hemorrhage of anus and rectum: Secondary | ICD-10-CM | POA: Diagnosis not present

## 2021-07-16 DIAGNOSIS — K5900 Constipation, unspecified: Secondary | ICD-10-CM

## 2021-07-16 MED ORDER — IOHEXOL 300 MG/ML  SOLN
100.0000 mL | Freq: Once | INTRAMUSCULAR | Status: AC | PRN
  Administered 2021-07-16: 100 mL via INTRAVENOUS

## 2021-07-19 ENCOUNTER — Other Ambulatory Visit: Payer: Self-pay

## 2021-07-19 DIAGNOSIS — K769 Liver disease, unspecified: Secondary | ICD-10-CM

## 2021-07-19 DIAGNOSIS — R194 Change in bowel habit: Secondary | ICD-10-CM

## 2021-07-19 DIAGNOSIS — R1084 Generalized abdominal pain: Secondary | ICD-10-CM

## 2021-07-19 NOTE — Addendum Note (Signed)
Addended by: Hardie Pulley, Barney Russomanno J on: 07/19/2021 10:59 AM   Modules accepted: Orders

## 2021-07-28 ENCOUNTER — Telehealth: Payer: Self-pay

## 2021-07-28 ENCOUNTER — Other Ambulatory Visit: Payer: Self-pay

## 2021-07-28 ENCOUNTER — Telehealth: Payer: Self-pay | Admitting: Internal Medicine

## 2021-07-28 DIAGNOSIS — K769 Liver disease, unspecified: Secondary | ICD-10-CM

## 2021-07-28 DIAGNOSIS — R1084 Generalized abdominal pain: Secondary | ICD-10-CM

## 2021-07-28 NOTE — Telephone Encounter (Signed)
Patient called her pharmacy and said they did not have her prep.  Can you please call it in for her again?  Thank you.

## 2021-07-28 NOTE — Telephone Encounter (Signed)
Plenvu sample available for pt to pick up from our office. Sample placed at front desk, 3rd floor. Called pt to make her aware. Unable to LVM d/t VM being full.

## 2021-07-28 NOTE — Telephone Encounter (Signed)
SECOND ATTEMPT (procedure scheduled 07/30/21):  Again called pt to inform about sample Plenvu. Unable to reach d/t VM being fullf

## 2021-07-28 NOTE — Telephone Encounter (Signed)
Pt returned call. Advised to pick up Plenvu sample on 3rd floor of our office. Verbalized acceptance and understanding.

## 2021-07-29 ENCOUNTER — Other Ambulatory Visit (INDEPENDENT_AMBULATORY_CARE_PROVIDER_SITE_OTHER): Payer: BC Managed Care – PPO

## 2021-07-29 DIAGNOSIS — R1084 Generalized abdominal pain: Secondary | ICD-10-CM

## 2021-07-29 DIAGNOSIS — K769 Liver disease, unspecified: Secondary | ICD-10-CM | POA: Diagnosis not present

## 2021-07-29 LAB — COMPREHENSIVE METABOLIC PANEL
ALT: 13 U/L (ref 0–35)
AST: 17 U/L (ref 0–37)
Albumin: 4.3 g/dL (ref 3.5–5.2)
Alkaline Phosphatase: 66 U/L (ref 39–117)
BUN: 6 mg/dL (ref 6–23)
CO2: 31 mEq/L (ref 19–32)
Calcium: 9.5 mg/dL (ref 8.4–10.5)
Chloride: 101 mEq/L (ref 96–112)
Creatinine, Ser: 0.98 mg/dL (ref 0.40–1.20)
GFR: 77.08 mL/min (ref >60.00)
Glucose, Bld: 85 mg/dL (ref 70–99)
Potassium: 3.9 mEq/L (ref 3.5–5.1)
Sodium: 139 mEq/L (ref 135–145)
Total Bilirubin: 0.6 mg/dL (ref 0.2–1.2)
Total Protein: 7.5 g/dL (ref 6.0–8.3)

## 2021-07-30 ENCOUNTER — Ambulatory Visit: Payer: BC Managed Care – PPO | Admitting: Internal Medicine

## 2021-07-30 ENCOUNTER — Encounter: Payer: Self-pay | Admitting: Internal Medicine

## 2021-07-30 ENCOUNTER — Other Ambulatory Visit: Payer: Self-pay

## 2021-07-30 VITALS — BP 131/75 | HR 66 | Temp 96.8°F | Resp 16 | Ht 61.0 in | Wt 161.0 lb

## 2021-07-30 DIAGNOSIS — K51418 Inflammatory polyps of colon with other complication: Secondary | ICD-10-CM

## 2021-07-30 DIAGNOSIS — K5900 Constipation, unspecified: Secondary | ICD-10-CM

## 2021-07-30 DIAGNOSIS — D124 Benign neoplasm of descending colon: Secondary | ICD-10-CM

## 2021-07-30 MED ORDER — SODIUM CHLORIDE 0.9 % IV SOLN: 500.0000 mL | Freq: Once | INTRAVENOUS | Status: DC

## 2021-07-30 NOTE — Patient Instructions (Signed)
Handouts given for polyps, hemorroids, high fiber diet and diverticulosis.  YOU HAD AN ENDOSCOPIC PROCEDURE TODAY AT [facility]:   Refer to the procedure report that was given to you for any specific questions about what was found during the examination.  If the procedure report does not answer your questions, please call your gastroenterologist to clarify.  If you requested that your care partner not be given the details of your procedure findings, then the procedure report has been included in a sealed envelope for you to review at your convenience later.  YOU SHOULD EXPECT: Some feelings of bloating in the abdomen. Passage of more gas than usual.  Walking can help get rid of the air that was put into your GI tract during the procedure and reduce the bloating. If you had a lower endoscopy (such as a colonoscopy or flexible sigmoidoscopy) you may notice spotting of blood in your stool or on the toilet paper. If you underwent a bowel prep for your procedure, you may not have a normal bowel movement for a few days.  Please Note:  You might notice some irritation and congestion in your nose or some drainage.  This is from the oxygen used during your procedure.  There is no need for concern and it should clear up in a day or so.  SYMPTOMS TO REPORT IMMEDIATELY:  Following lower endoscopy (colonoscopy):  Excessive amounts of blood in the stool  Significant tenderness or worsening of abdominal pains  Swelling of the abdomen that is new, acute  Fever of 100F or higher  For urgent or emergent issues, a gastroenterologist can be reached at any hour by calling [phone]. Do not use MyChart messaging for urgent concerns.    DIET:  We do recommend a small meal at first, but then you may proceed to your regular diet.  Drink plenty of fluids but you should avoid alcoholic beverages for 24 hours.  ACTIVITY:  You should plan to take it easy for the rest of today and you should NOT  DRIVE or use heavy machinery until tomorrow (because of the sedation medicines used during the test).    FOLLOW UP: Our staff will call the number listed on your records 48-72 hours following your procedure to check on you and address any questions or concerns that you may have regarding the information given to you following your procedure. If we do not reach you, we will leave a message.  We will attempt to reach you two times.  During this call, we will ask if you have developed any symptoms of COVID 19. If you develop any symptoms (ie: fever, flu-like symptoms, shortness of breath, cough etc.) before then, please call [phone].  If you test positive for Covid 19 in the 2 weeks post procedure, please call and report this information to [place].    If any biopsies were taken you will be contacted by phone or by letter within the next 1-3 weeks.  Please call us at [phone] if you have not heard about the biopsies in 3 weeks.    SIGNATURES/CONFIDENTIALITY: You and/or your care partner have signed paperwork which will be entered into your electronic medical record.  These signatures attest to the fact that that the information above on your After Visit Summary has been reviewed and is understood.  Full responsibility of the confidentiality of this discharge information lies with you and/or your care-partner.

## 2021-07-30 NOTE — Progress Notes (Signed)
DT VS 

## 2021-07-30 NOTE — Op Note (Signed)
[facility] Patient Name: Kristen Barnett Procedure Date: 07/30/2021 10:59 AM MRN: [id number] Endoscopist: Sonny Masters "Kristen Barnett ,  Age: 30 Referring MD:  Date of Birth: 05/09/1991 Gender: Female Account #: [id number] Procedure:                Colonoscopy Indications:              Rectal bleeding, Change in bowel habits,                            Constipation Medicines:                Monitored Anesthesia Care Procedure:                Pre-Anesthesia Assessment:                           - Prior to the procedure, a History and Physical                            was performed, and patient medications and                            allergies were reviewed. The patient's tolerance of                            previous anesthesia was also reviewed. The risks                            and benefits of the procedure and the sedation                            options and risks were discussed with the patient.                            All questions were answered, and informed consent                            was obtained. Prior Anticoagulants: The patient has                            taken no previous anticoagulant or antiplatelet                            agents. ASA Grade Assessment: II - A patient with                            mild systemic disease. After reviewing the risks                            and benefits, the patient was deemed in                            satisfactory condition to undergo the procedure.  After obtaining informed consent, the colonoscope                            was passed under direct vision. Throughout the                            procedure, the patient's blood pressure, pulse, and                            oxygen saturations were monitored continuously. The                            Olympus PCF-[id number] (#[id number]) Colonoscope was                            introduced through the anus and advanced to the the                             cecum, identified by appendiceal orifice and                            ileocecal valve. The colonoscopy was performed                            without difficulty. The patient tolerated the                            procedure well. The quality of the bowel                            preparation was good. The ileocecal valve,                            appendiceal orifice, and rectum were photographed.                            Rectal retroflexion was not performed due to a                            narrow rectal canal. Scope In: 11:08:04 AM Scope Out: 11:29:08 AM Scope Withdrawal Time: 0 hours 16 minutes 20 seconds  Total Procedure Duration: 0 hours 21 minutes 4 seconds  Findings:                 A 15 mm polyp was found in the descending colon.                            The polyp was pedunculated. The polyp was removed                            with a hot snare. Resection and retrieval were                            complete.  A few diverticula were found in the sigmoid colon.                           Non-bleeding internal hemorrhoids were found during                            retroflexion. Complications:            No immediate complications. Estimated Blood Loss:     Estimated blood loss was minimal. Impression:               - One 15 mm polyp in the descending colon, removed                            with a hot snare. Resected and retrieved.                           - Diverticulosis in the sigmoid colon.                           - Non-bleeding internal hemorrhoids. Recommendation:           - Discharge patient to home (with escort).                           - Continue Miralax therapy to help with                            constipation. Your previous rectal bleeding was                            likely due to hemorrhoids or due to your inflamed                            appearing polyp.                           -  Await pathology results.                           - The findings and recommendations were discussed                            with the patient. Sonny Masters "Kristen Barnett,  07/30/2021 11:34:29 AM

## 2021-07-30 NOTE — Progress Notes (Signed)
GASTROENTEROLOGY PROCEDURE H&P NOTE   Primary Care Physician: Patient, No Pcp Per (Inactive)    Reason for Procedure:   Change in bowel habits, history of rectal bleeding  Plan:    Colonoscopy  Patient is appropriate for endoscopic procedure(s) in the ambulatory ([place]) setting.  The nature of the procedure, as well as the risks, benefits, and alternatives were carefully and thoroughly reviewed with the patient. Ample time for discussion and questions allowed. The patient understood, was satisfied, and agreed to proceed.     HPI: Kristen Barnett is a 30 y.o. female who presents for colonoscopy for change in bowel habits and history of rectal bleeding. She was last seen in clinic on 07/02/21.  Past Medical History:  Diagnosis Date   Anxiety    Endometriosis    GERD (gastroesophageal reflux disease)    History -diet controlled - no meds   Irritable bowel syndrome     Past Surgical History:  Procedure Laterality Date   ROBOTIC ASSISTED LAPAROSCOPIC OVARIAN CYSTECTOMY Left 03/05/2013   Procedure: ROBOTIC ASSISTED LAPAROSCOPIC BILATERAL OVARIAN CYSTECTOMY; LYSIS OF ADHESIONS  With Treatment of Endometriosis;  Surgeon: Princess Bruins, MD;  Location: [facility];  Service: Gynecology;  Laterality: Left;  2 hrs.    Prior to Admission medications   Medication Sig Start Date End Date Taking? Authorizing Provider  linaclotide (LINZESS) 145 MCG CAPS capsule Linzess 145 mcg capsule  Take 1 capsule every day by oral route for 90 days.    [provider]  norgestimate-ethinyl estradiol (ORTHO-CYCLEN) 0.25-35 MG-MCG tablet Take 1 tablet by mouth daily.    [provider]  PEG-KCl-NaCl-NaSulf-Na Asc-C (PLENVU) 140 g SOLR Take 140 g by mouth as directed. Manufacturer's coupon Universal coupon code:BIN: P2366821; GROUP: [id number]; PCN: [id number]; ID: [id number]; PAY NO MORE $50 07/02/21   Sharyn Creamer, MD    Current Outpatient Medications  Medication Sig Dispense Refill    linaclotide (LINZESS) 145 MCG CAPS capsule Linzess 145 mcg capsule  Take 1 capsule every day by oral route for 90 days.     norgestimate-ethinyl estradiol (ORTHO-CYCLEN) 0.25-35 MG-MCG tablet Take 1 tablet by mouth daily.     PEG-KCl-NaCl-NaSulf-Na Asc-C (PLENVU) 140 g SOLR Take 140 g by mouth as directed. Manufacturer's coupon Universal coupon code:BIN: P2366821; GROUP: [id number]; PCN: [id number]; ID: [id number]; PAY NO MORE $50 1 each 0   No current facility-administered medications for this visit.    Allergies as of 07/30/2021 - Review Complete 07/16/2021  Allergen Reaction Noted   Metronidazole Nausea And Vomiting 11/14/2012    Family History  Problem Relation Age of Onset   Diabetes Father    Colon cancer Neg Hx    Esophageal cancer Neg Hx    Pancreatic cancer Neg Hx    Stomach cancer Neg Hx    Liver disease Neg Hx     Social History   Socioeconomic History   Marital status: Single    Spouse name: Not on file   Number of children: Not on file   Years of education: Not on file   Highest education level: Not on file  Occupational History   Not on file  Tobacco Use   Smoking status: Former    Types: Cigarettes, Cigars   Smokeless tobacco: Never   Tobacco comments:    Formerly smoked cigars  Substance and Sexual Activity   Alcohol use: Yes    Comment: occasional   Drug use: No   Sexual activity: Yes    Birth control/protection: None  Other Topics Concern   Not on file  Social History Narrative   Not on file   Social Determinants of Health   Financial Resource Strain: Not on file  Food Insecurity: Not on file  Transportation Needs: Not on file  Physical Activity: Not on file  Stress: Not on file  Social Connections: Not on file  Intimate Partner Violence: Not on file    Physical Exam: Vital signs in last 24 hours: LMP 07/13/2021 (Exact Date)  GEN: NAD EYE: Sclerae anicteric ENT: MMM CV: Non-tachycardic Pulm: No increased work of breathing GI: Soft,  NT/ND NEURO:  Alert & Oriented   Christia Reading, MD [facility]  07/30/2021 9:57 AM

## 2021-07-30 NOTE — Progress Notes (Signed)
Called to room to assist during endoscopic procedure.  Patient ID and intended procedure confirmed with present staff. Received instructions for my participation in the procedure from the performing physician.  

## 2021-07-30 NOTE — Progress Notes (Signed)
To PACU, VSS. Report to [email or web address] 

## 2021-08-02 ENCOUNTER — Telehealth: Payer: Self-pay

## 2021-08-02 DIAGNOSIS — Z0279 Encounter for issue of other medical certificate: Secondary | ICD-10-CM

## 2021-08-02 NOTE — Telephone Encounter (Signed)
FMLA paperwork request by patient completed on 07-29-21.  Dr. Lorenso Courier completed forms and they are available for patient to pick up or for her to instruct [place] where she want them sent after she has paid the fee.Marland Kitchen

## 2021-08-03 ENCOUNTER — Telehealth: Payer: Self-pay | Admitting: *Deleted

## 2021-08-03 ENCOUNTER — Encounter: Payer: Self-pay | Admitting: *Deleted

## 2021-08-03 NOTE — Telephone Encounter (Signed)
Second follow up call attempt. Unable to leave message.

## 2021-08-03 NOTE — Telephone Encounter (Signed)
First follow up call attempt.  LVM. 

## 2021-08-05 ENCOUNTER — Encounter: Payer: Self-pay | Admitting: Internal Medicine

## 2021-08-12 ENCOUNTER — Other Ambulatory Visit: Payer: Self-pay

## 2021-08-12 ENCOUNTER — Ambulatory Visit (HOSPITAL_COMMUNITY)
Admission: RE | Admit: 2021-08-12 | Discharge: 2021-08-12 | Disposition: A | Discharge status: Home / Self Care | Payer: BC Managed Care – PPO | Source: Ambulatory Visit | Attending: Internal Medicine | Admitting: Internal Medicine

## 2021-08-12 DIAGNOSIS — K769 Liver disease, unspecified: Secondary | ICD-10-CM | POA: Diagnosis present

## 2021-08-12 MED ORDER — GADOBUTROL 1 MMOL/ML IV SOLN
7.0000 mL | Freq: Once | INTRAVENOUS | Status: AC | PRN
  Administered 2021-08-12: 14:00:00 7 mL via INTRAVENOUS

## 2021-08-13 ENCOUNTER — Encounter: Payer: Self-pay | Admitting: Internal Medicine

## 2021-08-20 ENCOUNTER — Ambulatory Visit (HOSPITAL_COMMUNITY): Payer: BC Managed Care – PPO

## 2021-08-27 ENCOUNTER — Ambulatory Visit: Payer: BC Managed Care – PPO | Admitting: Internal Medicine

## 2021-08-30 ENCOUNTER — Telehealth: Payer: Self-pay | Admitting: Internal Medicine

## 2021-08-30 NOTE — Telephone Encounter (Signed)
Patient called and stated that she was moving over the weekend and has misplaced her Linzess. Requesting a refill. Please advise.

## 2021-08-31 MED ORDER — LINACLOTIDE 145 MCG PO CAPS: ORAL_CAPSULE | ORAL | 1 refills | Status: DC

## 2021-08-31 NOTE — Telephone Encounter (Signed)
RX sent

## 2021-09-02 NOTE — Telephone Encounter (Signed)
Inbound call from patient states insurance will not cover Linzess until the end of the month and she can't afford it as it is over $400. Patient is wondering if there is anything else she can do or take

## 2021-09-09 ENCOUNTER — Ambulatory Visit (INDEPENDENT_AMBULATORY_CARE_PROVIDER_SITE_OTHER): Payer: BC Managed Care – PPO | Admitting: Internal Medicine

## 2021-09-09 ENCOUNTER — Encounter: Payer: Self-pay | Admitting: Internal Medicine

## 2021-09-09 VITALS — BP 122/84 | HR 81 | Ht 61.0 in | Wt 166.0 lb

## 2021-09-09 DIAGNOSIS — K5900 Constipation, unspecified: Secondary | ICD-10-CM

## 2021-09-09 DIAGNOSIS — K769 Liver disease, unspecified: Secondary | ICD-10-CM | POA: Diagnosis not present

## 2021-09-09 MED ORDER — LINACLOTIDE 145 MCG PO CAPS: ORAL_CAPSULE | ORAL | 1 refills | Status: DC

## 2021-09-09 NOTE — Patient Instructions (Signed)
Drink 8 cups of water per day  Try Smooth Move Tea   Continue Linzess 145 mcg daily, We have sent a new prescription to your pharmacy   Follow up in 3 months  If you are age 32 or older, your body mass index should be between 23-30. Your Body mass index is 31.37 kg/m. If this is out of the aforementioned range listed, please consider follow up with your Primary Care Provider.  If you are age 2 or younger, your body mass index should be between 19-25. Your Body mass index is 31.37 kg/m. If this is out of the aformentioned range listed, please consider follow up with your Primary Care Provider.   ________________________________________________________  The [facility] providers would like to encourage you to use [facility] to communicate with providers for non-urgent requests or questions.  Due to long hold times on the telephone, sending your provider a message by [facility] may be a faster and more efficient way to get a response.  Please allow 48 business hours for a response.  Please remember that this is for non-urgent requests.  _______________________________________________________   I appreciate the  opportunity to care for you  Thank You   Dayna Barker, MD

## 2021-09-09 NOTE — Progress Notes (Signed)
CHIEF COMPLAINT: Constipation  HISTORY OF PRESENT ILLNESS:  31 year old female with history of endometriosis GERD, constipation, S/P robotic assisted laparoscopic ovarian cystectomy with lysis of adhesions with treatment of endometriosis 2014 presents for constipation  Interval History: She is still having issues with constipation. She takes Linzess once a day and is now having daily BMs. The consistency of her stools is mushy. She has never had a completely normal BMs. She has never had children in the past. Denies abdominal pain. She has been still taking her fiber gummies.  She tried the Miralax but thought that it did not help her have a BM fast enough.  The Linzess works within a few hours, whereas the MiraLAX will take a day to sometimes work.  She never took more than 1 dose of MiraLAX per day.  Ideally patient states that she would like to get off of medications to help her have a BM.  She may start a stool softener to see if this helps with her bowel habits.  Patient is about to switch health insurance so she may have a brief lapse in coverage  Wt Readings from Last 3 Encounters:  09/09/21 166 lb (75.3 kg)  07/30/21 161 lb (73 kg)  07/02/21 161 lb 6.4 oz (73.2 kg)   Outpatient Encounter Medications as of 09/09/2021  Medication Sig   IBUPROFEN PO Take by mouth.   norgestimate-ethinyl estradiol (ORTHO-CYCLEN) 0.25-35 MG-MCG tablet Take 1 tablet by mouth daily.   [DISCONTINUED] linaclotide (LINZESS) 145 MCG CAPS capsule Linzess 145 mcg capsule  Take 1 capsule every day by oral route for 90 days.   linaclotide (LINZESS) 145 MCG CAPS capsule Linzess 145 mcg capsule  Take 1 capsule every day by oral route for 90 days.   [DISCONTINUED] ibuprofen (ADVIL) 200 MG tablet Take 200 mg by mouth every 6 (six) hours as needed.   No facility-administered encounter medications on file as of 09/09/2021.    PHYSICAL EXAM: BP 122/84    Pulse 81    Ht 5\' 1"  (1.549 m)    Wt 166 lb (75.3 kg)    SpO2 98%     BMI 31.37 kg/m  Head: Normocephalic and atraumatic. Eyes:  Sclerae non-icteric, conjunctive pink. Ears: Normal auditory acuity. Mouth: Dentition intact. No ulcers or lesions.  Neck: Supple, no lymphadenopathy or thyromegaly.  Lungs: Clear bilaterally to auscultation without wheezes, crackles or rhonchi. Heart: Regular rate and rhythm. No murmur, rub or gallop appreciated.  Abdomen: Soft, nontender, non distended. No masses. No hepatosplenomegaly. Quiet bowel sounds Musculoskeletal: Symmetrical with no gross deformities. Skin: Warm and dry. No rash or lesions on visible extremities. Extremities: No edema. Neurological: Alert oriented x 4, no focal deficits.  Psychological:  Alert and cooperative. Normal mood and affect.  Labs 04/2021: CMP, CBC, and TSH nml  Labs 07/2021: CMP unremarkable  CT A/P w/contrast 07/16/21: IMPRESSION: No acute intra-abdominal or pelvic finding by CT. 2.2 cm vague hypodense right hepatic dome lesion, incompletely characterized. See above comment. Recommend follow-up nonemergent MRI. Small uterine fibroids  MRI Liver 08/02/21: IMPRESSION: 1. 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 Consider more definitive characterization and assessment of stability with follow-up hepatic protocol abdominal MRI with and without EOVIST (hepatobiliary contrast agent) in 6 months. 2. No other significant findings within the abdomen.  Colonoscopy 07/30/21: - A 15 mm polyp was found in the descending colon. The polyp was pedunculated. The polyp was  removed with a hot snare. Resection and retrieval - A few diverticula were found in the sigmoid colon. - Non-bleeding internal hemorrhoids were found during retroflexion. Path: Surgical [P], colon, descending, polyp (1) - INFLAMMATORY POLYP WITH EROSION. - NO ADENOMATOUS CHANGE OR MALIGNANCY.  ASSESSMENT AND  PLAN:  Constipation Liver lesion Patient presents with persistent issues with constipation that appear to be responding to Linzess therapy.  Patient no longer describes any issues with abdominal discomfort or rectal bleeding.  She had a colonoscopy in 07/2021 that showed an inflammatory polyp that was removed and hemorrhoids but was otherwise fairly unremarkable.  I do wonder if her prior history of laparoscopic surgery may be resulting in some pelvic floor dysfunction so patient may benefit from pelvic floor physical therapy.  Patient would like to hold off on pelvic floor PT for now, and will consider during her next follow-up appointment.  She did have a CT and follow-up MRI of her liver that showed a hepatic lesion most consistent with benign focal nodular hyperplasia.  Will plan for follow up imaging to ensure that the lesion remains stable. - Start drinking 8 cups of water per day - Can try daily docusate - Can try SmoothMove tea - Continue daily fiber supplement - Refilled Linzess for 90 day supply. Will give some samples since patient will be switching insurance companies. - Repeat MRI Liver 12/2021 - RTC in 3 months. Patient will consider pelvic floor PT.   Sharyn Creamer  I spent 44 minutes of time, including in depth chart review, independent review of results as outlined above, communicating results with the patient directly, face-to-face time with the patient, coordinating care, ordering studies and medications as appropriate, and documentation.

## 2021-09-15 NOTE — Telephone Encounter (Signed)
Kristen Barnett, if we have samples of Amitiza 4mcg for bid dosing, please provide he with samples for 1 week and if tolerated then I will send in RX. If not, she can take Milk of Magnesia 2 tablespoons every 2nd or 3rd night as needed.  THX

## 2021-11-30 ENCOUNTER — Telehealth: Payer: Self-pay | Admitting: Internal Medicine

## 2021-11-30 ENCOUNTER — Encounter: Payer: Self-pay | Admitting: Internal Medicine

## 2021-11-30 NOTE — Telephone Encounter (Signed)
See MyChart message

## 2021-11-30 NOTE — Telephone Encounter (Signed)
Patient called, states she was told to call if she needed another Linzess sample. Per patient, she is waiting for insurance to kick in. Please advise.  ?

## 2021-12-10 ENCOUNTER — Encounter: Payer: Self-pay | Admitting: Internal Medicine

## 2022-01-03 ENCOUNTER — Telehealth: Payer: Self-pay | Admitting: Internal Medicine

## 2022-01-03 NOTE — Telephone Encounter (Signed)
Left message on machine to call back  

## 2022-01-03 NOTE — Telephone Encounter (Signed)
Patient called inquiring on sample refills for Linaclotide ( Linzness). Please hive a call back to advise.  Thank you.

## 2022-01-04 ENCOUNTER — Other Ambulatory Visit: Payer: Self-pay

## 2022-01-04 DIAGNOSIS — K5900 Constipation, unspecified: Secondary | ICD-10-CM

## 2022-01-04 MED ORDER — LINACLOTIDE 145 MCG PO CAPS: 145.0000 ug | ORAL_CAPSULE | Freq: Every day | ORAL | 0 refills | Status: DC

## 2022-01-06 NOTE — Telephone Encounter (Signed)
Pt now insured and per Dr. Libby Maw request, Rx sent to pt preferred pharmacy:  Outpatient Medication Detail   Disp Refills Start End   linaclotide (LINZESS) 145 MCG CAPS capsule 30 capsule 0 01/04/2022    Sig - Route: Take 1 capsule (145 mcg total) by mouth daily. Must keep appt as scheduled - Oral   Sent to pharmacy as: linaclotide Rolan Lipa) 145 MCG Cap capsule   E-Prescribing Status: Receipt confirmed by pharmacy (01/04/2022  9:05 AM EDT)

## 2022-01-21 NOTE — Telephone Encounter (Signed)
Patient called requesting to speak with a nurse regarding her Linzess refill request.

## 2022-01-24 NOTE — Telephone Encounter (Signed)
No naswer. No voicemail. Has an appointment 02/04/21. Linzess filled last month. Unsure what she needs in regards to her Linzess. She is supposed to keep her appointment to determine further refills.

## 2022-01-25 ENCOUNTER — Encounter: Payer: Self-pay | Admitting: Internal Medicine

## 2022-01-25 ENCOUNTER — Other Ambulatory Visit: Payer: Self-pay

## 2022-01-25 DIAGNOSIS — K5900 Constipation, unspecified: Secondary | ICD-10-CM

## 2022-01-25 MED ORDER — LINACLOTIDE 145 MCG PO CAPS: 145.0000 ug | ORAL_CAPSULE | Freq: Every day | ORAL | 0 refills | Status: DC

## 2022-02-04 ENCOUNTER — Ambulatory Visit (INDEPENDENT_AMBULATORY_CARE_PROVIDER_SITE_OTHER): Payer: BC Managed Care – PPO | Admitting: Internal Medicine

## 2022-02-04 ENCOUNTER — Encounter: Payer: Self-pay | Admitting: Internal Medicine

## 2022-02-04 VITALS — BP 124/86 | HR 88 | Ht 61.0 in | Wt 175.2 lb

## 2022-02-04 DIAGNOSIS — K5900 Constipation, unspecified: Secondary | ICD-10-CM | POA: Diagnosis not present

## 2022-02-04 DIAGNOSIS — K769 Liver disease, unspecified: Secondary | ICD-10-CM | POA: Diagnosis not present

## 2022-02-18 ENCOUNTER — Encounter: Payer: Self-pay | Admitting: Internal Medicine

## 2022-02-21 ENCOUNTER — Other Ambulatory Visit: Payer: Self-pay | Admitting: *Deleted

## 2022-02-21 DIAGNOSIS — K769 Liver disease, unspecified: Secondary | ICD-10-CM

## 2022-03-08 ENCOUNTER — Telehealth: Payer: Self-pay | Admitting: *Deleted

## 2022-03-08 NOTE — Telephone Encounter (Signed)
-----   Message from April H Pait sent at 02/10/2022  9:18 AM EDT ----- Regarding: RE: MRI Abdomen Attention Liver Yes just fyi, pt does not have voice mail set up, unable to leave a message to call to schedule. ----- Message ----- From: Oda Kilts, CMA Sent: 02/10/2022   9:14 AM EDT To: April H Pait Subject: RE: MRI Abdomen Attention Liver                Hey, Is it orders put in that you are needing  ----- Message ----- From: Sheldon Silvan, April H Sent: 02/10/2022   8:43 AM EDT To: Oda Kilts, CMA Subject: FW: MRI Abdomen Attention Liver                 ----- Message ----- From: Cathlean Cower Sent: 02/10/2022   8:38 AM EDT To: April H Pait Subject: RE: MRI Abdomen Attention Liver                Vm is not set up ----- Message ----- From: Augustine Radar Sent: 02/04/2022  11:06 AM EDT To: Cathlean Cower Subject: FW: MRI Abdomen Attention Liver                 ----- Message ----- From: Oda Kilts, CMA Sent: 02/04/2022  10:35 AM EDT To: April H Pait; Roosvelt Maser Subject: MRI Abdomen Attention Liver                    Put orders in for MRI Abdomen/attention to liver

## 2022-05-17 ENCOUNTER — Telehealth: Payer: Self-pay | Admitting: Internal Medicine

## 2022-05-17 DIAGNOSIS — K5900 Constipation, unspecified: Secondary | ICD-10-CM

## 2022-05-17 MED ORDER — LINACLOTIDE 145 MCG PO CAPS: 145.0000 ug | ORAL_CAPSULE | Freq: Every day | ORAL | 0 refills | Status: DC

## 2022-05-17 NOTE — Telephone Encounter (Signed)
Patient is requesting a 90 day supply of Linzess

## 2022-05-17 NOTE — Telephone Encounter (Signed)
Rx for Linzess sent to pharmacy for 30 day supply as patient is overdue for follow up appointment.

## 2022-05-19 MED ORDER — LINACLOTIDE 145 MCG PO CAPS: 145.0000 ug | ORAL_CAPSULE | Freq: Every day | ORAL | 0 refills | Status: DC

## 2022-05-19 NOTE — Addendum Note (Signed)
Addended by: Stevan Born on: 05/19/2022 04:00 PM   Modules accepted: Orders

## 2022-05-19 NOTE — Telephone Encounter (Addendum)
Patient called states she is having picking up her Linzess 145 mg at her pharmacy and would like to that to be send to [address] [phone]. States she really need it today.

## 2022-05-19 NOTE — Telephone Encounter (Signed)
Rx sent as requested to [facility]

## 2022-12-22 ENCOUNTER — Telehealth: Payer: Self-pay | Admitting: Internal Medicine

## 2022-12-22 ENCOUNTER — Other Ambulatory Visit: Payer: Self-pay

## 2022-12-22 DIAGNOSIS — K5900 Constipation, unspecified: Secondary | ICD-10-CM

## 2022-12-22 MED ORDER — LINACLOTIDE 145 MCG PO CAPS: 145.0000 ug | ORAL_CAPSULE | Freq: Every day | ORAL | 1 refills | Status: DC

## 2022-12-22 NOTE — Telephone Encounter (Signed)
PT is calling to get a refill on Linzess. She is asking that it be a 90 day supply and sent to [facility] on [address]. Please advise.

## 2023-03-22 IMAGING — MR MR ABDOMEN WO/W CM
19 series · 48 of 48 positions shown · IV contrast (gadavist)
Comparison: CT July 16, 2021

CLINICAL DATA: Further characterization of hepatic lesions seen on
prior CT.

EXAM:
MRI ABDOMEN WITHOUT AND WITH CONTRAST
TECHNIQUE: Multiplanar multisequence MR imaging of the abdomen was performed
both before and after the administration of intravenous contrast.
CONTRAST:  7mL GADAVIST GADOBUTROL 1 MMOL/ML IV SOLN

[Series 2: DWI · axial · 6.0mm · 1.42mm/px · z∈[-112,+112]mm · 3 of 64 slices shown (1 of 2)]
[im 1/64]
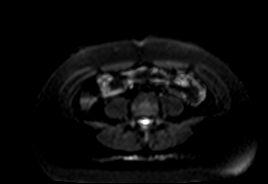
[im 32/64]
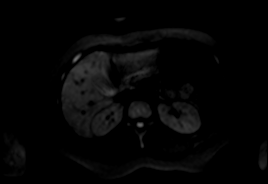
[im 64/64]
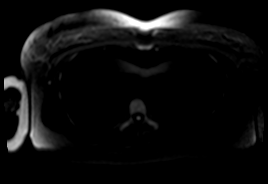

[Series 3: DWI · axial · 6.0mm · 1.42mm/px · 1 of 32 slices shown (2 of 2)]
[im 1/32]
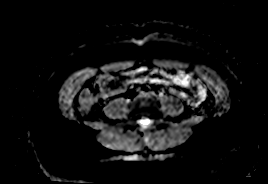

[Series 4: T2 fat-sat · axial · 6.0mm · 1.17mm/px · 1 of 32 slices shown]
[im 1/32]
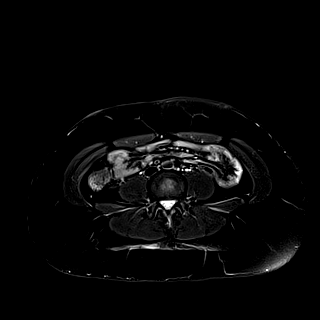

[Series 7: cor haste · coronal · 6.0mm · 1.46mm/px · 1 of 36 slices shown]
[im 1/36]
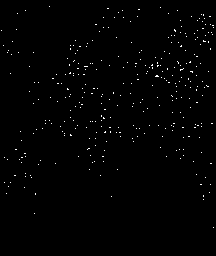

[Series 8: bSSFP · axial · 6.0mm · 2.03mm/px · z∈[-164,+112]mm · 2 of 47 slices shown]
[im 1/47]
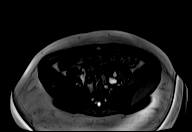
[im 47/47]
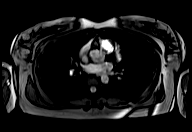

[Series 9: T1 dynamic · axial · 3.0mm · 1.35mm/px · z∈[-169,+116]mm · 3 of 96 slices shown (1 of 10)]
[im 1/96]
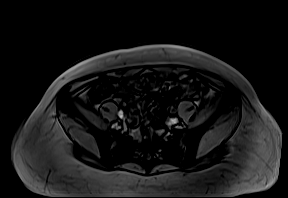
[im 48/96]
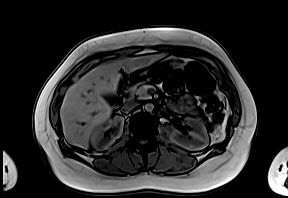
[im 96/96]
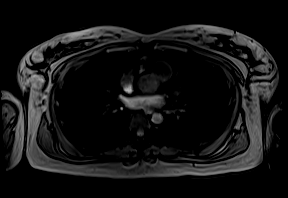

[Series 10: T1 dynamic · axial · 3.0mm · 1.35mm/px · z∈[-169,+116]mm · 3 of 96 slices shown (2 of 10)]
[im 1/96]
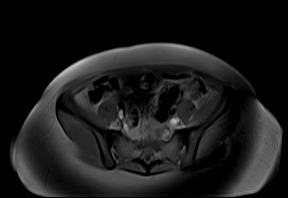
[im 48/96]
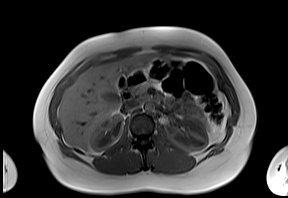
[im 96/96]
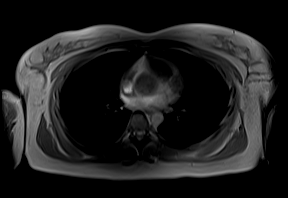

[Series 12: T1 dynamic · axial · 3.0mm · 1.35mm/px · z∈[-169,+116]mm · 3 of 96 slices shown (3 of 10)]
[im 1/96]
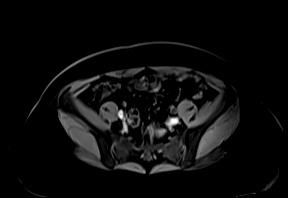
[im 48/96]
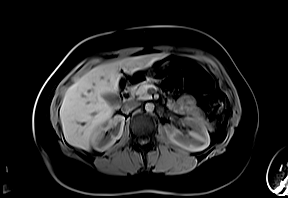
[im 96/96]
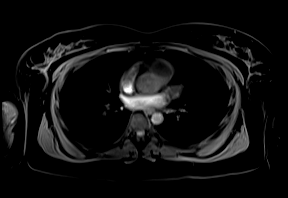

[Series 17: T1 dynamic · axial · 3.0mm · 1.35mm/px · z∈[-169,+116]mm · 3 of 96 slices shown (4 of 10)]
[im 1/96]
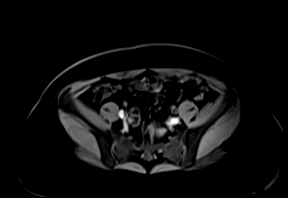
[im 48/96]
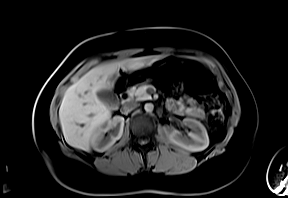
[im 96/96]
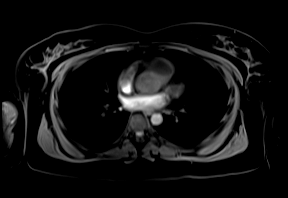

[Series 19: T1 dynamic · axial · 3.0mm · 1.35mm/px · z∈[-169,+116]mm · 3 of 96 slices shown (5 of 10)]
[im 1/96]
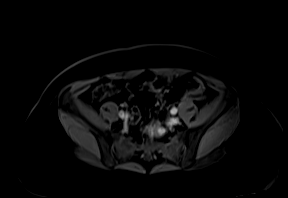
[im 48/96]
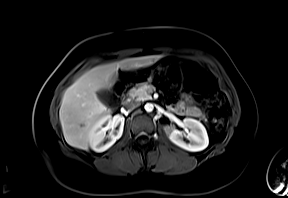
[im 96/96]
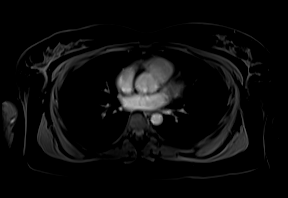

[Series 20: T1 dynamic · axial · 3.0mm · 1.35mm/px · z∈[-169,+116]mm · 3 of 96 slices shown (6 of 10)]
[im 1/96]
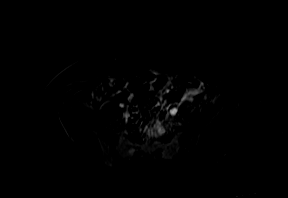
[im 48/96]
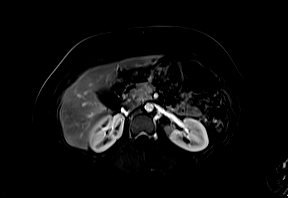
[im 96/96]
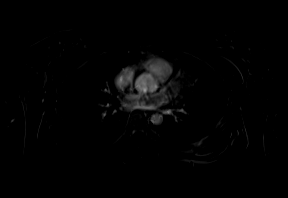

[Series 22: T1 dynamic · axial · 3.0mm · 1.35mm/px · z∈[-169,+116]mm · 3 of 96 slices shown (7 of 10)]
[im 1/96]
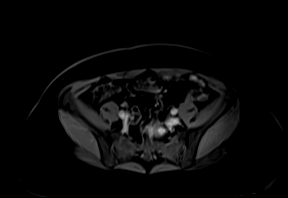
[im 48/96]
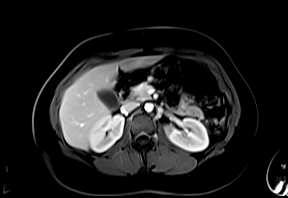
[im 96/96]
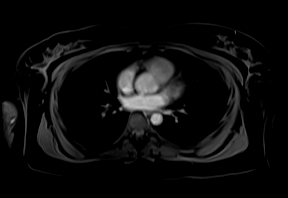

[Series 23: T1 dynamic · axial · 3.0mm · 1.35mm/px · z∈[-169,+116]mm · 3 of 96 slices shown (8 of 10)]
[im 1/96]
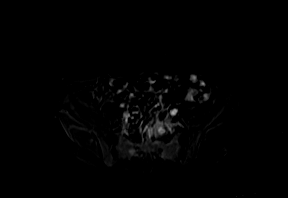
[im 48/96]
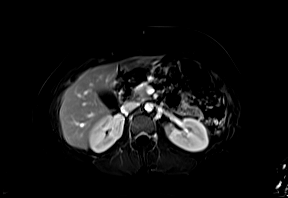
[im 96/96]
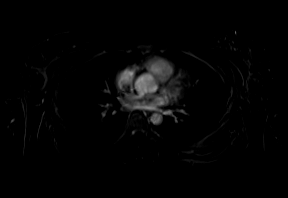

[Series 25: T1 dynamic · axial · 3.0mm · 1.35mm/px · z∈[-169,+116]mm · 3 of 96 slices shown (9 of 10)]
[im 1/96]
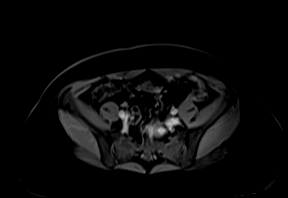
[im 48/96]
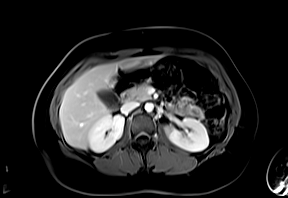
[im 96/96]
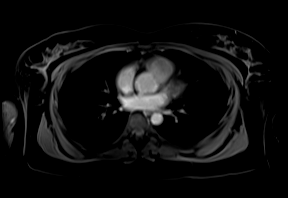

[Series 26: T1 dynamic · axial · 3.0mm · 1.35mm/px · z∈[-169,+116]mm · 3 of 96 slices shown (10 of 10)]
[im 1/96]
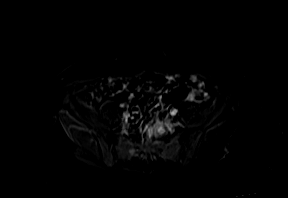
[im 48/96]
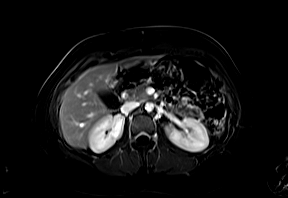
[im 96/96]
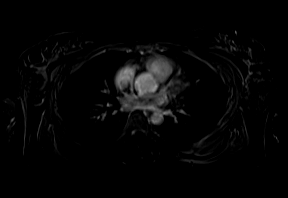

[Series 27: ax_haste_mbh · axial · 6.0mm · 1.22mm/px · 1 of 40 slices shown]
[im 1/40]
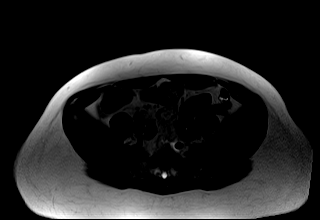

[Series 29: cor_vibe_dixon_delayed_w · coronal · 3.0mm · 1.98mm/px · 3 of 80 slices shown]
[im 1/80]
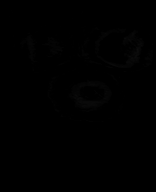
[im 40/80]
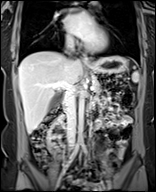
[im 80/80]
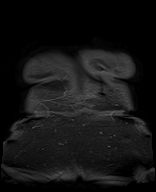

[Series 31: ax_dixon_delayed_w_reg · axial · 3.0mm · 1.35mm/px · z∈[-169,+116]mm · 3 of 96 slices shown]
[im 1/96]
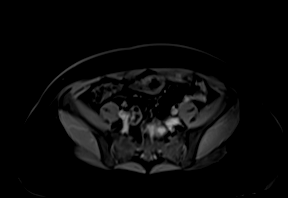
[im 48/96]
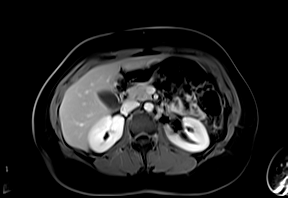
[im 96/96]
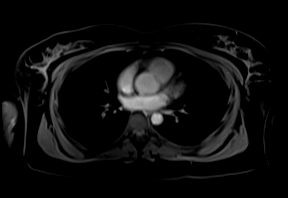

[Series 32: ax_dixon_delayed_w_reg_sub · axial · 3.0mm · 1.35mm/px · z∈[-169,+116]mm · 3 of 96 slices shown]
[im 1/96]
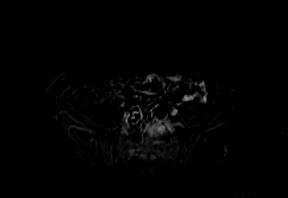
[im 48/96]
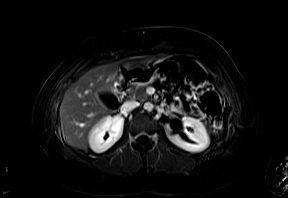
[im 96/96]
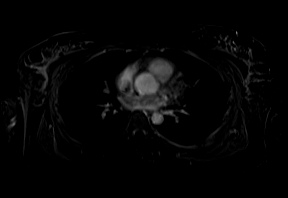

[48 of 48 positions shown; findings below may reference images not displayed]

FINDINGS: Lower chest: No acute abnormality.

Hepatobiliary: Early arterially Arterially enhancing 2.2 x 2.0 cm
segment VII/VIII hepatic lesion on image 29/19, which is relatively
isointense to portal venous phase on image 29/19. The peripheral
aspect of the lesion is relatively stealth in intrinsic T1 and T2
signal with a central stellate area that is mildly T2 hyperintense
and demonstrates retention of contrast enhancement on delayed
postcontrast phase image 29/31. No additional hepatic lesions
identified.

Gallbladder is unremarkable.  No biliary ductal dilation.

Pancreas: Intrinsic T1 signal a pancreatic parenchyma is within
normal limits. No pancreatic ductal dilation. No cystic or solid
hyperenhancing pancreatic lesion identified.

Spleen:  Within normal limits in size and appearance.

Adrenals/Urinary Tract: No masses identified. No evidence of
hydronephrosis.

Stomach/Bowel: Visualized portions within the abdomen are
unremarkable.

Vascular/Lymphatic: No pathologically enlarged lymph nodes
identified. No abdominal aortic aneurysm demonstrated.

Other:  None.

Musculoskeletal: No suspicious bone lesions identified.
IMPRESSION: 1. Arterially enhancing 2.2 x 2.0 cm segment VII/VIII hepatic lesion
with imaging characteristics most consistent with benign focal
nodular hyperplasia. Consider more definitive characterization and
assessment of stability with follow-up hepatic protocol abdominal
MRI with and without EOVIST (hepatobiliary contrast agent) in 6
months.
2. No other significant findings within the abdomen.

## 2023-04-03 ENCOUNTER — Ambulatory Visit: Payer: BC Managed Care – PPO | Admitting: Internal Medicine

## 2023-04-03 ENCOUNTER — Encounter: Payer: Self-pay | Admitting: Internal Medicine

## 2023-04-03 VITALS — BP 126/70 | HR 68 | Ht 61.0 in | Wt 193.0 lb

## 2023-04-03 DIAGNOSIS — K5900 Constipation, unspecified: Secondary | ICD-10-CM

## 2023-04-03 DIAGNOSIS — K769 Liver disease, unspecified: Secondary | ICD-10-CM

## 2023-04-03 MED ORDER — LINACLOTIDE 145 MCG PO CAPS: 145.0000 ug | ORAL_CAPSULE | Freq: Every day | ORAL | 1 refills | Status: DC

## 2023-04-03 NOTE — Progress Notes (Unsigned)
CHIEF COMPLAINT: Constipation  HISTORY OF PRESENT ILLNESS:  32 year old female with history of endometriosis, GERD, constipation, S/P robotic assisted laparoscopic ovarian cystectomy with lysis of adhesions with treatment of endometriosis 2014 presents for follow up of constipation  Interval History: She has continued to have issues with constipation. She is still taking Linzess daily.  Initially when she was taking the Linzess, it worked great.  However, now she cannot gauge exactly when the Linzess will induce a BM. She started a new job that only has a small number of restrooms available to a large number of people.  She would prefer to be able to work from home if possible in order to be able to use the restroom when needed.  IBGard does help with bloating so she has been taking PRN for some abdominal discomfort. She has been trying to eat more greens by taking supplements such as Supergreen. Benefiber has not really helped in the past. She was not able to complete her referral to pelvic floor PT due to a transition in her insurance. Denies abdominal pain.  OB/GYN did a transvaginal ultrasound was negative for endometriosis but they are willing to offer a laparoscopy to the patient if she is interested.  Wt Readings from Last 3 Encounters:  04/03/23 193 lb (87.5 kg)  02/04/22 175 lb 4 oz (79.5 kg)  09/09/21 166 lb (75.3 kg)   Outpatient Encounter Medications as of 04/03/2023  Medication Sig   docusate sodium (COLACE) 100 MG capsule Take 100 mg by mouth as needed for mild constipation.   linaclotide (LINZESS) 145 MCG CAPS capsule Take 1 capsule (145 mcg total) by mouth daily.   norgestimate-ethinyl estradiol (ORTHO-CYCLEN) 0.25-35 MG-MCG tablet Take 1 tablet by mouth daily.   Aspirin Effervescent (ALKA-SELTZER ORIGINAL PO) Take 2 tablets by mouth as needed. (Patient not taking: Reported on 04/03/2023)   IBUPROFEN PO Take by mouth. (Patient not taking: Reported on 04/03/2023)   No  facility-administered encounter medications on file as of 04/03/2023.    PHYSICAL EXAM: BP 126/70   Pulse 68   Ht 5\' 1"  (1.549 m)   Wt 193 lb (87.5 kg)   BMI 36.47 kg/m  Head: Normocephalic and atraumatic. Eyes:  Sclerae non-icteric, conjunctive pink. Ears: Normal auditory acuity. Mouth: Dentition intact. No ulcers or lesions.  Neck: Supple, no lymphadenopathy or thyromegaly.  Lungs: Clear bilaterally to auscultation without wheezes, crackles or rhonchi. Heart: Regular rate and rhythm. Abdomen: Soft, nontender, non distended.  Quiet bowel sounds Musculoskeletal: Symmetrical with no gross deformities. Skin: Warm and dry. No rash or lesions on visible extremities. Extremities: No edema. Neurological: Alert oriented x 4, no focal deficits.  Psychological:  Alert and cooperative. Normal mood and affect.  Labs 04/2021: CMP, CBC, and TSH nml  Labs 07/2021: CMP unremarkable  CT A/P w/contrast 07/16/21: IMPRESSION: No acute intra-abdominal or pelvic finding by CT. 2.2 cm vague hypodense right hepatic dome lesion, incompletely characterized. See above comment. Recommend follow-up nonemergent MRI. Small uterine fibroids  MRI Liver 08/02/21: IMPRESSION: 1. 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 Consider more definitive characterization and assessment of stability with follow-up hepatic protocol abdominal MRI with and without EOVIST (hepatobiliary contrast agent) in 6 months. 2. No other significant findings within the abdomen.  Colonoscopy 07/30/21: - A 15 mm polyp was found in the descending colon. The polyp was pedunculated. The polyp was removed with a hot snare. Resection and retrieval -  A few diverticula were found in the sigmoid colon. - Non-bleeding internal hemorrhoids were found during retroflexion. Path: Surgical [P], colon, descending, polyp (1) - INFLAMMATORY  POLYP WITH EROSION. - NO ADENOMATOUS CHANGE OR MALIGNANCY.  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  I asked her to try to start daily Metamucil therapy to see if this helps with her constipation.  Kiwis have helped somewhat with her constipation as well.  Will attempt to get her set up with pelvic floor PT to see if this helps with her constipation further.  Will plan for MRI liver to follow-up her liver lesions seen on prior MRI. - Continue drinking 8 cups of water per day - Continue to eat 2 kiwis per day - Start daily Metamucil - Cont Linzess 145 mcg every day. Refill - Referral to pelvic floor PT - Repeat MRI Liver to follow up liver lesion - Continue OB/GYN visits - Filled out forms to allow her to work from home - RTC in 3 months  Imogene Burn  I spent 42 minutes of time, including in depth chart review, independent review of results as outlined above, communicating results with the patient directly, face-to-face time with the patient, coordinating care, ordering studies and medications as appropriate, and documentation.

## 2023-04-03 NOTE — Patient Instructions (Addendum)
You have been scheduled for an MRI at [facility] on 04/11/23. Your appointment time is 9 am. Please arrive to admitting (at main entrance of the hospital) 30 minutes prior to your appointment time for registration purposes. Please make certain not to have anything to eat or drink 6 hours prior to your test. In addition, if you have any metal in your body, have a pacemaker or defibrillator, please be sure to let your ordering physician know. This test typically takes 45 minutes to 1 hour to complete. Should you need to reschedule, please call [phone] to do so.  Start taking Metamucil daily  You are scheduled for a follow up visit on 07/04/23 at 10:50 am  We have sent the following medications to your pharmacy for you to pick up at your convenience: Linzess   If your blood pressure at your visit was 140/90 or greater, please contact your primary care physician to follow up on this.  _______________________________________________________  If you are age 75 or older, your body mass index should be between 23-30. Your Body mass index is 36.47 kg/m. If this is out of the aforementioned range listed, please consider follow up with your Primary Care Provider.  If you are age 19 or younger, your body mass index should be between 19-25. Your Body mass index is 36.47 kg/m. If this is out of the aformentioned range listed, please consider follow up with your Primary Care Provider.   ________________________________________________________  The [place] [facility] providers would like to encourage you to use [facility] to communicate with providers for non-urgent requests or questions.  Due to long hold times on the telephone, sending your provider a message by [facility] may be a faster and more efficient way to get a response.  Please allow 48 business hours for a response.  Please remember that this is for non-urgent requests.  _______________________________________________________

## 2023-04-10 ENCOUNTER — Telehealth: Payer: Self-pay | Admitting: Internal Medicine

## 2023-04-10 NOTE — Telephone Encounter (Signed)
Inbound call from Blanchie Dessert with [employer] team   Call back number [phone]  Fax number [phone]  States she will fax over patient papaerwork to [phone].

## 2023-04-11 ENCOUNTER — Ambulatory Visit (HOSPITAL_COMMUNITY): Payer: BC Managed Care – PPO

## 2023-06-21 DIAGNOSIS — Z0279 Encounter for issue of other medical certificate: Secondary | ICD-10-CM

## 2023-06-22 ENCOUNTER — Telehealth: Payer: Self-pay | Admitting: Internal Medicine

## 2023-06-22 NOTE — Telephone Encounter (Signed)
Patient returned call regarding paying for FMLA paper work. Requested a can return call. Thank you

## 2023-07-04 ENCOUNTER — Ambulatory Visit: Payer: BC Managed Care – PPO | Admitting: Internal Medicine

## 2023-07-04 ENCOUNTER — Telehealth: Payer: Self-pay | Admitting: Internal Medicine

## 2023-07-04 NOTE — Telephone Encounter (Signed)
Error

## 2023-07-04 NOTE — Telephone Encounter (Signed)
Patient called stated she noticed in her MyChart that for the office visit on 05/13/21 there is some information that is not accurate in her chart and she would like to get it corrected. Please advise.

## 2023-10-18 ENCOUNTER — Telehealth: Payer: Self-pay | Admitting: Internal Medicine

## 2023-10-18 NOTE — Telephone Encounter (Signed)
 Patient called and stated that she received a call regarding her FMLA paperwork. Patient is requesting a call back. Please advise.

## 2023-10-18 NOTE — Telephone Encounter (Signed)
 Called patient per provider request regarding FMLA papers no answer left message on voicemail per provider patient will have to go to her PCP regarding future FMLA request patient did not follow up with provider.

## 2023-10-24 ENCOUNTER — Ambulatory Visit: Payer: BC Managed Care – PPO | Admitting: Internal Medicine

## 2023-10-24 ENCOUNTER — Encounter: Payer: Self-pay | Admitting: Internal Medicine

## 2023-10-24 VITALS — BP 132/90 | HR 72 | Ht 61.0 in | Wt 202.1 lb

## 2023-10-24 DIAGNOSIS — K769 Liver disease, unspecified: Secondary | ICD-10-CM | POA: Diagnosis not present

## 2023-10-24 DIAGNOSIS — K5900 Constipation, unspecified: Secondary | ICD-10-CM

## 2023-10-24 NOTE — Progress Notes (Unsigned)
 CHIEF COMPLAINT: Constipation  HISTORY OF PRESENT ILLNESS:  33 year old female with history of endometriosis, GERD, constipation, S/P robotic assisted laparoscopic ovarian cystectomy with lysis of adhesions with treatment of endometriosis 2014 presents for follow up of constipation  Interval History: She is still having issues with constipation. The Linzess is helping. She did have one episode of worsened constipation last 05/2023 when she took a laxative to help with constipation. Now she on average has one BM every other day. The higher dose of Linzess caused her to have diarrhea. Her currently dose of Linzess appears to be helping with her constipation, but she sometimes does still feel like she does not fully evacuate her rectal vault. Denies ab pain. She has been trying different diets to see what helps her. She never did the pelvic floor PT because she saw her OB/GYN and she was referred to an endometriosis specialist. Patient is staying well hydrated and physically active  Wt Readings from Last 3 Encounters:  10/24/23 202 lb 2 oz (91.7 kg)  04/03/23 193 lb (87.5 kg)  02/04/22 175 lb 4 oz (79.5 kg)   Outpatient Encounter Medications as of 10/24/2023  Medication Sig   AMBULATORY NON FORMULARY MEDICATION [place] 1 dose by mouth daily   APPLE CIDER VINEGAR PO Take 2 tablets by mouth 3 (three) times daily. GUMMIES   Bacillus Coagulans-Inulin (PROBIOTIC-PREBIOTIC PO) Take 3 tablets by mouth daily. GUMMIES   linaclotide (LINZESS) 145 MCG CAPS capsule Take 1 capsule (145 mcg total) by mouth daily.   norgestimate-ethinyl estradiol (ORTHO-CYCLEN) 0.25-35 MG-MCG tablet Take 1 tablet by mouth daily.   Peppermint Oil (IBGARD) 90 MG CPCR Take 1 capsule by mouth as needed.   docusate sodium (COLACE) 100 MG capsule Take 100 mg by mouth as needed for mild constipation. (Patient not taking: Reported on 10/24/2023)   IBUPROFEN PO Take by mouth. (Patient not taking: Reported on 10/24/2023)    [DISCONTINUED] Aspirin Effervescent (ALKA-SELTZER ORIGINAL PO) Take 2 tablets by mouth as needed. (Patient not taking: Reported on 04/03/2023)   No facility-administered encounter medications on file as of 10/24/2023.    PHYSICAL EXAM: BP (!) 120/90 (BP Location: Left Arm, Patient Position: Sitting, Cuff Size: Large)   Pulse 72   Ht 5\' 1"  (1.549 m)   Wt 202 lb 2 oz (91.7 kg)   LMP 10/09/2023   BMI 38.19 kg/m  Head: Normocephalic and atraumatic. Eyes:  Sclerae non-icteric, conjunctive pink. Ears: Normal auditory acuity. Mouth: Dentition intact. No ulcers or lesions.  Neck: Supple, no lymphadenopathy or thyromegaly.  Lungs: Clear bilaterally to auscultation without wheezes, crackles or rhonchi. Heart: Regular rate and rhythm. Abdomen: Soft, nontender, non distended.  Quiet bowel sounds Musculoskeletal: Symmetrical with no gross deformities. Skin: Warm and dry. No rash or lesions on visible extremities. Extremities: No edema. Neurological: Alert oriented x 4, no focal deficits.  Psychological:  Alert and cooperative. Normal mood and affect.  Labs 04/2021: CMP, CBC, and TSH nml  Labs 07/2021: CMP unremarkable  CT A/P w/contrast 07/16/21: IMPRESSION: No acute intra-abdominal or pelvic finding by CT. 2.2 cm vague hypodense right hepatic dome lesion, incompletely characterized. See above comment. Recommend follow-up nonemergent MRI. Small uterine fibroids  MRI Liver 08/02/21: IMPRESSION: 1. 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 Consider more definitive characterization and assessment of stability with follow-up hepatic protocol abdominal MRI with and without EOVIST (hepatobiliary contrast agent) in 6 months. 2. No other  significant findings within the abdomen.  Colonoscopy 07/30/21: - A 15 mm polyp was found in the descending colon. The polyp was pedunculated. The polyp was  removed with a hot snare. Resection and retrieval - A few diverticula were found in the sigmoid colon. - Non-bleeding internal hemorrhoids were found during retroflexion. Path: Surgical [P], colon, descending, polyp (1) - INFLAMMATORY POLYP WITH EROSION. - NO ADENOMATOUS CHANGE OR MALIGNANCY.  ASSESSMENT AND PLAN:  Constipation Liver lesion, most likely benign focal nodular hyperplasia Patient overall seems to be doing fairly well on Linzess therapy, though she does not feel like her constipation is fully controlled.  She did have an exacerbation of constipation last year in 05/2023 for which she used extra laxatives to help alleviate her constipation.  For the most part though on the Linzess she has 1 bowel movement every other day.  I suggested that she try pelvic floor physical therapy, but the patient is hesitant at this time.  She will let me know when she is ready for pelvic floor physical therapy.  Patient has been trying different diets including whole foods to try to help with her constipation in a more natural way.  Patient was previously noted to have a liver lesion that is most likely benign follow-up MRI was suggested to assess for stability and to provide more definitive characterization. - Patient is trying different diets - Cont Linzess 145 mcg every day. Refill - Deferred on pelvic floor PT - Repeat MRI Liver to follow up liver lesion - Will provide letter for justification of Linzess due to upcoming [place] trip - RTC in 1 year  Imogene Burn  I spent 44 minutes of time, including in depth chart review, independent review of results as outlined above, communicating results with the patient directly, face-to-face time with the patient, coordinating care, ordering studies and medications as appropriate, and documentation.

## 2023-10-24 NOTE — Patient Instructions (Signed)
 You have been scheduled for an MRI at [facility]  on 10/26/23. Your appointment time is 12:30 pm. Please arrive to admitting (at main entrance of the hospital) 30 minutes prior to your appointment time for registration purposes. Please make certain not to have anything to eat or drink 6 hours prior to your test. In addition, if you have any metal in your body, have a pacemaker or defibrillator, please be sure to let your ordering physician know. This test typically takes 45 minutes to 1 hour to complete. Should you need to reschedule, please call [phone] to do so.   If your blood pressure at your visit was 140/90 or greater, please contact your primary care physician to follow up on this.  _______________________________________________________  If you are age 34 or older, your body mass index should be between 23-30. Your Body mass index is 38.19 kg/m. If this is out of the aforementioned range listed, please consider follow up with your Primary Care Provider.  If you are age 18 or younger, your body mass index should be between 19-25. Your Body mass index is 38.19 kg/m. If this is out of the aformentioned range listed, please consider follow up with your Primary Care Provider.   ________________________________________________________  The [place] [facility] providers would like to encourage you to use [facility] to communicate with providers for non-urgent requests or questions.  Due to long hold times on the telephone, sending your provider a message by [facility] may be a faster and more efficient way to get a response.  Please allow 48 business hours for a response.  Please remember that this is for non-urgent requests.  _______________________________________________________  Due to recent changes in healthcare laws, you may see the results of your imaging and laboratory studies on MyChart before your provider has had a chance to review them.  We understand that in some cases there may be results  that are confusing or concerning to you. Not all laboratory results come back in the same time frame and the provider may be waiting for multiple results in order to interpret others.  Please give [place] 48 hours in order for your provider to thoroughly review all the results before contacting the office for clarification of your results.    Thank you for entrusting me with your care and for choosing [facility],  Dr. Eulah Pont

## 2023-10-26 ENCOUNTER — Ambulatory Visit (HOSPITAL_COMMUNITY)

## 2023-11-20 ENCOUNTER — Ambulatory Visit (HOSPITAL_COMMUNITY): Admission: RE | Admit: 2023-11-20 | Source: Ambulatory Visit

## 2023-12-22 ENCOUNTER — Other Ambulatory Visit: Payer: Self-pay

## 2023-12-22 ENCOUNTER — Telehealth: Payer: Self-pay | Admitting: Internal Medicine

## 2023-12-22 DIAGNOSIS — K5900 Constipation, unspecified: Secondary | ICD-10-CM

## 2023-12-22 MED ORDER — LINACLOTIDE 145 MCG PO CAPS: 145.0000 ug | ORAL_CAPSULE | Freq: Every day | ORAL | 1 refills | Status: DC

## 2023-12-22 NOTE — Telephone Encounter (Signed)
 Called patient back regarding Linzess  refill was sent to pharmacy was unable to leave voice message patient mail box was full.

## 2023-12-22 NOTE — Telephone Encounter (Signed)
 Inbound call from patient requesting a refill for Linzess . States she only has 1 pill left. Please advise, thank you.

## 2024-03-29 ENCOUNTER — Telehealth: Payer: Self-pay

## 2024-03-29 NOTE — Telephone Encounter (Signed)
 Received a work from home/intermittent leave form from patient on 03/28/24 requesting Dr. Lafonda review. Form has been placed in MD's office.

## 2024-03-29 NOTE — Telephone Encounter (Signed)
 Form faxed to number provided, copy made, and original form mailed back home to patient. Pt made aware.

## 2024-07-21 ENCOUNTER — Other Ambulatory Visit: Payer: Self-pay | Admitting: Internal Medicine

## 2024-07-21 DIAGNOSIS — K5900 Constipation, unspecified: Secondary | ICD-10-CM

## 2024-07-22 ENCOUNTER — Telehealth: Payer: Self-pay | Admitting: Internal Medicine

## 2024-07-22 DIAGNOSIS — K5900 Constipation, unspecified: Secondary | ICD-10-CM

## 2024-07-22 MED ORDER — LINACLOTIDE 145 MCG PO CAPS: 145.0000 ug | ORAL_CAPSULE | Freq: Every day | ORAL | 1 refills | Status: DC

## 2024-07-22 NOTE — Telephone Encounter (Signed)
 Inbound call from patient stating she scheduled a follow up visit and would like to refill medication linzess  until upcoming appointment. Please advise  Thank you

## 2024-07-22 NOTE — Telephone Encounter (Signed)
Linzess sent to pharmacy

## 2024-08-20 ENCOUNTER — Encounter: Payer: Self-pay | Admitting: Gastroenterology

## 2024-08-20 ENCOUNTER — Ambulatory Visit: Admitting: Gastroenterology

## 2024-08-20 VITALS — BP 130/80 | HR 80 | Ht 61.5 in | Wt 192.2 lb

## 2024-08-20 DIAGNOSIS — K769 Liver disease, unspecified: Secondary | ICD-10-CM

## 2024-08-20 DIAGNOSIS — K5900 Constipation, unspecified: Secondary | ICD-10-CM

## 2024-08-20 DIAGNOSIS — R112 Nausea with vomiting, unspecified: Secondary | ICD-10-CM | POA: Diagnosis not present

## 2024-08-20 DIAGNOSIS — K5730 Diverticulosis of large intestine without perforation or abscess without bleeding: Secondary | ICD-10-CM

## 2024-08-20 DIAGNOSIS — K5909 Other constipation: Secondary | ICD-10-CM | POA: Diagnosis not present

## 2024-08-20 MED ORDER — LINACLOTIDE 145 MCG PO CAPS: 145.0000 ug | ORAL_CAPSULE | Freq: Every day | ORAL | 3 refills | Status: AC

## 2024-08-20 MED ORDER — ONDANSETRON HCL 4 MG PO TABS: 4.0000 mg | ORAL_TABLET | Freq: Every day | ORAL | 1 refills | Status: AC | PRN

## 2024-08-20 NOTE — Patient Instructions (Addendum)
 Diverticulosis  Printout provided as request  Constipation Continue Linzess  145 mcg po daily, take 1 capsule 30-45 mins before breakfast with full glass of water.  If no BM after 2 days please take dose of OTC Miralax Recommend high fiber diet  MRI for liver- need to call and reschedule [phone]  _______________________________________________________  If your blood pressure at your visit was 140/90 or greater, please contact your primary care physician to follow up on this.  _______________________________________________________  If you are age 76 or older, your body mass index should be between 23-30. Your Body mass index is 35.74 kg/m. If this is out of the aforementioned range listed, please consider follow up with your Primary Care Provider.  If you are age 57 or younger, your body mass index should be between 19-25. Your Body mass index is 35.74 kg/m. If this is out of the aformentioned range listed, please consider follow up with your Primary Care Provider.   ________________________________________________________  The [place] [facility] providers would like to encourage you to use MYCHART to communicate with providers for non-urgent requests or questions.  Due to long hold times on the telephone, sending your provider a message by [facility] may be a faster and more efficient way to get a response.  Please allow 48 business hours for a response.  Please remember that this is for non-urgent requests.  _______________________________________________________  [facility] is using a team-based approach to care.  Your team is made up of your doctor and two to three APPS. Our APPS (Nurse Practitioners and Physician Assistants) work with your physician to ensure care continuity for you. They are fully qualified to address your health concerns and develop a treatment plan. They communicate directly with your gastroenterologist to care for you. Seeing the Advanced Practice  Practitioners on your physician's team can help you by facilitating care more promptly, often allowing for earlier appointments, access to diagnostic testing, procedures, and other specialty referrals.   Thank you for trusting me with your gastrointestinal care. Deanna May, FNP-C

## 2024-08-20 NOTE — Progress Notes (Signed)
 I agree with the assessment and plan as outlined by Ms. May.

## 2024-08-20 NOTE — Progress Notes (Signed)
 "  Chief Complaint:follow-up constipation Primary GI Doctor:Dr. Federico  HPI: 34 year old female with history of endometriosis, GERD, constipation, S/P robotic assisted laparoscopic ovarian cystectomy with lysis of adhesions with treatment of endometriosis 2014 presents for follow up of constipation   Interval History Patient last seen in GI office on 10/24/23 by Dr. Federico for constipation  Patient is taking Linzess  145 mcg po daily for chronic constipation. She reports it works most of the time she has occasional episodes where she goes 2-3 days without BM and she will add OTC Miralax or smooth move tea.  If she does get constipated she will have abdominal discomfort and nausea. She reports the 290 mcg dose is too strong and causes diarrhea.  She has pending laparoscopic procedure for endometriosis this year.    Wt Readings from Last 3 Encounters:  08/20/24 192 lb 4 oz (87.2 kg)  10/24/23 202 lb 2 oz (91.7 kg)  04/03/23 193 lb (87.5 kg)    Past Medical History:  Diagnosis Date   Anxiety    Depression    Endometriosis    GERD (gastroesophageal reflux disease)    History -diet controlled - no meds   Irritable bowel syndrome     Past Surgical History:  Procedure Laterality Date   COLONOSCOPY     ROBOTIC ASSISTED LAPAROSCOPIC OVARIAN CYSTECTOMY Left 03/05/2013   Procedure: ROBOTIC ASSISTED LAPAROSCOPIC BILATERAL OVARIAN CYSTECTOMY; LYSIS OF ADHESIONS  With Treatment of Endometriosis;  Surgeon: Marie-Lyne Lavoie, MD;  Location: [facility];  Service: Gynecology;  Laterality: Left;  2 hrs.    Current Outpatient Medications  Medication Sig Dispense Refill   docusate sodium (COLACE) 100 MG capsule Take 100 mg by mouth as needed for mild constipation.     IBUPROFEN PO Take by mouth.     norgestimate-ethinyl estradiol (ORTHO-CYCLEN) 0.25-35 MG-MCG tablet Take 1 tablet by mouth daily.     ondansetron  (ZOFRAN ) 4 MG tablet Take 1 tablet (4 mg total) by mouth daily as needed for nausea or  vomiting. 20 tablet 1   Peppermint Oil (IBGARD) 90 MG CPCR Take 1 capsule by mouth as needed.     AMBULATORY NON FORMULARY MEDICATION [place] 1 dose by mouth daily (Patient not taking: Reported on 08/20/2024)     linaclotide  (LINZESS ) 145 MCG CAPS capsule Take 1 capsule (145 mcg total) by mouth daily. 90 capsule 3   No current facility-administered medications for this visit.    Allergies as of 08/20/2024 - Review Complete 08/20/2024  Allergen Reaction Noted   Flagyl [metronidazole] Nausea And Vomiting 11/14/2012    Family History  Problem Relation Age of Onset   Diabetes Father    Colon cancer Neg Hx    Esophageal cancer Neg Hx    Pancreatic cancer Neg Hx    Stomach cancer Neg Hx    Liver disease Neg Hx    Rectal cancer Neg Hx     Review of Systems:    Constitutional: No weight loss, fever, chills, weakness or fatigue HEENT: Eyes: No change in vision               Ears, Nose, Throat:  No change in hearing or congestion Skin: No rash or itching Cardiovascular: No chest pain, chest pressure or palpitations   Respiratory: No SOB or cough Gastrointestinal: See HPI and otherwise negative Genitourinary: No dysuria or change in urinary frequency Neurological: No headache, dizziness or syncope Musculoskeletal: No new muscle or joint pain Hematologic: No bleeding or bruising Psychiatric: No history  of depression or anxiety    Physical Exam:  Vital signs: BP 130/80 (BP Location: Left Arm, Patient Position: Sitting, Cuff Size: Large)   Pulse 80   Ht 5' 1.5 (1.562 m) Comment: height measured without shoes  Wt 192 lb 4 oz (87.2 kg)   LMP 07/19/2024   BMI 35.74 kg/m   Constitutional:   Pleasant female appears to be in NAD, Well developed, Well nourished, alert and cooperative Eyes:   PEERL, EOMI. No icterus. Conjunctiva pink. Neck:  Supple Throat: Oral cavity and pharynx without inflammation, swelling or lesion.  Respiratory: Respirations even and unlabored. Lungs clear to  auscultation bilaterally.   No wheezes, crackles, or rhonchi.  Cardiovascular: Normal S1, S2. Regular rate and rhythm. No peripheral edema, cyanosis or pallor.  Gastrointestinal:  Soft, nondistended, nontender. No rebound or guarding. Normal bowel sounds. No appreciable masses or hepatomegaly. Rectal:  Not performed.  Anoscopy: Msk:  Symmetrical without gross deformities. Without edema, no deformity or joint abnormality.  Neurologic:  Alert and  oriented x4;  grossly normal neurologically.  Skin:   Dry and intact without significant lesions or rashes.  RELEVANT LABS AND IMAGING: CBC    Latest Ref Rng & Units 05/13/2021   10:12 AM 03/05/2013   12:14 PM  CBC  WBC 4.0 - 10.5 K/uL 5.2  4.7   Hemoglobin 12.0 - 15.0 g/dL 87.8  86.8   Hematocrit 36.0 - 46.0 % 37.0  39.1   Platelets 150.0 - 400.0 K/uL 207.0  230      CMP     Latest Ref Rng & Units 07/29/2021    3:52 PM 05/13/2021   10:12 AM  CMP  Glucose 70 - 99 mg/dL 85  81   BUN 6 - 23 mg/dL 6  8   Creatinine 9.59 - 1.20 mg/dL 9.01  9.22   Sodium 864 - 145 mEq/L 139  138   Potassium 3.5 - 5.1 mEq/L 3.9  3.9   Chloride 96 - 112 mEq/L 101  104   CO2 19 - 32 mEq/L 31  26   Calcium 8.4 - 10.5 mg/dL 9.5  9.0   Total Protein 6.0 - 8.3 g/dL 7.5  6.9   Total Bilirubin 0.2 - 1.2 mg/dL 0.6  0.5   Alkaline Phos 39 - 117 U/L 66  51   AST 0 - 37 U/L 17  12   ALT 0 - 35 U/L 13  9      Lab Results  Component Value Date   TSH 2.47 05/13/2021   Labs 04/2021: CMP, CBC, and TSH nml   Labs 07/2021: CMP unremarkable  CT A/P w/contrast 07/16/21: IMPRESSION: No acute intra-abdominal or pelvic finding by CT. 2.2 cm vague hypodense right hepatic dome lesion, incompletely characterized. See above comment. Recommend follow-up nonemergent MRI. Small uterine fibroids   MRI Liver 08/02/21: IMPRESSION: 1. Arterially enhancing 2.2 x 2.0 cm segment VII/VIII hepatic lesion with imaging characteristics most consistent with benign focal nodular  hyperplasia. Consider more definitive characterization and assessment of stability with follow-up hepatic protocol abdominal MRI with and without EOVIST (hepatobiliary contrast agent) in 6 months. 2. No other significant findings within the abdomen.   Colonoscopy 07/30/21: - A 15 mm polyp was found in the descending colon. The polyp was pedunculated. The polyp was removed with a hot snare. Resection and retrieval - A few diverticula were found in the sigmoid colon. - Non-bleeding internal hemorrhoids were found during retroflexion. Path: Surgical [P], colon, descending, polyp (1) - INFLAMMATORY  POLYP WITH EROSION. - NO ADENOMATOUS CHANGE OR MALIGNANCY.   Assessment: Encounter Diagnoses  Name Primary?   Constipation, unspecified constipation type Yes   Liver lesion    Nausea and vomiting, unspecified vomiting type    Diverticulosis of colon without hemorrhage   Patients chronic constipation well controlled with Linzess  145mcg po daily with prn OTC Miralax. Refilled medication. Also reinforced high fiber diet with plenty of fluids.  She also enquires about her diverticulosis and would like more information, printed out pamphlet with information.  Patient was previously noted to have a liver lesion that is most likely benign follow-up MRI was suggested to assess for stability and to provide more definitive characterization. She has not scheduled this yet, but will do so today.  Plan: - Cont Linzess  145 mcg every day. Refilled - Repeat MRI Liver to follow up liver lesion -follow-up in 1 year with APP or Dr. Federico  Thank you for the courtesy of this consult. Please call me with any questions or concerns.   Orvill Coulthard, FNP-C [facility] 08/20/2024, 11:47 AM  Cc: No ref. provider found  "
